# Patient Record
Sex: Female | Born: 1970 | Race: White | Hispanic: No | Marital: Married | Smoking: Never smoker
Health system: Southern US, Community
[De-identification: ages and names within clinical notes are randomized; demographics above are authoritative.]

## PROBLEM LIST (undated history)

## (undated) DIAGNOSIS — Z87442 Personal history of urinary calculi: Secondary | ICD-10-CM

## (undated) DIAGNOSIS — G90529 Complex regional pain syndrome I of unspecified lower limb: Secondary | ICD-10-CM

## (undated) DIAGNOSIS — R112 Nausea with vomiting, unspecified: Secondary | ICD-10-CM

## (undated) DIAGNOSIS — N736 Female pelvic peritoneal adhesions (postinfective): Secondary | ICD-10-CM

## (undated) DIAGNOSIS — R011 Cardiac murmur, unspecified: Secondary | ICD-10-CM

## (undated) DIAGNOSIS — F449 Dissociative and conversion disorder, unspecified: Secondary | ICD-10-CM

## (undated) DIAGNOSIS — N200 Calculus of kidney: Secondary | ICD-10-CM

## (undated) DIAGNOSIS — Z9889 Other specified postprocedural states: Secondary | ICD-10-CM

## (undated) DIAGNOSIS — T8859XA Other complications of anesthesia, initial encounter: Secondary | ICD-10-CM

## (undated) DIAGNOSIS — N879 Dysplasia of cervix uteri, unspecified: Secondary | ICD-10-CM

## (undated) DIAGNOSIS — M549 Dorsalgia, unspecified: Secondary | ICD-10-CM

## (undated) DIAGNOSIS — Z8489 Family history of other specified conditions: Secondary | ICD-10-CM

## (undated) DIAGNOSIS — G5600 Carpal tunnel syndrome, unspecified upper limb: Secondary | ICD-10-CM

## (undated) DIAGNOSIS — R200 Anesthesia of skin: Secondary | ICD-10-CM

## (undated) DIAGNOSIS — I209 Angina pectoris, unspecified: Secondary | ICD-10-CM

## (undated) DIAGNOSIS — M47816 Spondylosis without myelopathy or radiculopathy, lumbar region: Secondary | ICD-10-CM

## (undated) DIAGNOSIS — K589 Irritable bowel syndrome without diarrhea: Secondary | ICD-10-CM

## (undated) DIAGNOSIS — I951 Orthostatic hypotension: Secondary | ICD-10-CM

## (undated) DIAGNOSIS — T4145XA Adverse effect of unspecified anesthetic, initial encounter: Secondary | ICD-10-CM

## (undated) HISTORY — PX: CARPAL TUNNEL RELEASE: SHX101

## (undated) HISTORY — PX: APPENDECTOMY: SHX54

## (undated) HISTORY — PX: LAPAROSCOPIC CHOLECYSTECTOMY: SUR755

## (undated) HISTORY — DX: Dysplasia of cervix uteri, unspecified: N87.9

## (undated) HISTORY — PX: COLPOSCOPY: SHX161

## (undated) HISTORY — DX: Complex regional pain syndrome i of unspecified lower limb: G90.529

## (undated) HISTORY — PX: CYSTOSCOPY: SUR368

## (undated) HISTORY — DX: Spondylosis without myelopathy or radiculopathy, lumbar region: M47.816

## (undated) HISTORY — PX: ERCP: SHX60

## (undated) HISTORY — PX: OTHER SURGICAL HISTORY: SHX169

## (undated) HISTORY — DX: Female pelvic peritoneal adhesions (postinfective): N73.6

## (undated) HISTORY — PX: KNEE ARTHROSCOPY: SUR90

## (undated) HISTORY — PX: GYNECOLOGIC CRYOSURGERY: SHX857

## (undated) HISTORY — PX: EXPLORATORY LAPAROTOMY: SUR591

## (undated) HISTORY — DX: Calculus of kidney: N20.0

## (undated) HISTORY — DX: Dissociative and conversion disorder, unspecified: F44.9

## (undated) HISTORY — DX: Carpal tunnel syndrome, unspecified upper limb: G56.00

---

## 1999-11-30 ENCOUNTER — Other Ambulatory Visit: Admission: RE | Admit: 1999-11-30 | Discharge: 1999-11-30 | Payer: Self-pay | Admitting: Gynecology

## 2000-03-18 ENCOUNTER — Inpatient Hospital Stay (HOSPITAL_COMMUNITY): Admission: AD | Admit: 2000-03-18 | Discharge: 2000-03-18 | Payer: Self-pay | Admitting: Gynecology

## 2000-04-10 ENCOUNTER — Encounter: Admission: RE | Admit: 2000-04-10 | Discharge: 2000-07-09 | Payer: Self-pay | Admitting: Gynecology

## 2000-05-26 ENCOUNTER — Inpatient Hospital Stay (HOSPITAL_COMMUNITY): Admission: AD | Admit: 2000-05-26 | Discharge: 2000-05-26 | Payer: Self-pay | Admitting: Gynecology

## 2000-05-27 ENCOUNTER — Inpatient Hospital Stay (HOSPITAL_COMMUNITY): Admission: AD | Admit: 2000-05-27 | Discharge: 2000-05-27 | Payer: Self-pay | Admitting: Gynecology

## 2000-06-08 ENCOUNTER — Inpatient Hospital Stay (HOSPITAL_COMMUNITY): Admission: AD | Admit: 2000-06-08 | Discharge: 2000-06-08 | Payer: Self-pay | Admitting: *Deleted

## 2000-06-17 ENCOUNTER — Inpatient Hospital Stay (HOSPITAL_COMMUNITY): Admission: AD | Admit: 2000-06-17 | Discharge: 2000-06-20 | Payer: Self-pay | Admitting: Internal Medicine

## 2000-07-30 ENCOUNTER — Other Ambulatory Visit: Admission: RE | Admit: 2000-07-30 | Discharge: 2000-07-30 | Payer: Self-pay | Admitting: *Deleted

## 2001-06-05 ENCOUNTER — Encounter (INDEPENDENT_AMBULATORY_CARE_PROVIDER_SITE_OTHER): Payer: Self-pay | Admitting: Specialist

## 2001-06-05 ENCOUNTER — Encounter: Payer: Self-pay | Admitting: Surgery

## 2001-06-06 ENCOUNTER — Inpatient Hospital Stay (HOSPITAL_COMMUNITY): Admission: EM | Admit: 2001-06-06 | Discharge: 2001-06-08 | Payer: Self-pay | Admitting: Emergency Medicine

## 2001-06-07 ENCOUNTER — Encounter: Payer: Self-pay | Admitting: General Surgery

## 2001-06-12 ENCOUNTER — Encounter (INDEPENDENT_AMBULATORY_CARE_PROVIDER_SITE_OTHER): Payer: Self-pay | Admitting: Specialist

## 2001-06-12 ENCOUNTER — Ambulatory Visit (HOSPITAL_COMMUNITY): Admission: RE | Admit: 2001-06-12 | Discharge: 2001-06-12 | Payer: Self-pay | Admitting: Gastroenterology

## 2001-08-27 HISTORY — PX: PELVIC LAPAROSCOPY: SHX162

## 2001-10-03 ENCOUNTER — Other Ambulatory Visit: Admission: RE | Admit: 2001-10-03 | Discharge: 2001-10-03 | Payer: Self-pay | Admitting: Obstetrics and Gynecology

## 2002-03-30 ENCOUNTER — Ambulatory Visit (HOSPITAL_COMMUNITY): Admission: RE | Admit: 2002-03-30 | Discharge: 2002-03-30 | Payer: Self-pay | Admitting: Obstetrics and Gynecology

## 2002-03-30 ENCOUNTER — Encounter (INDEPENDENT_AMBULATORY_CARE_PROVIDER_SITE_OTHER): Payer: Self-pay | Admitting: Specialist

## 2002-10-05 ENCOUNTER — Other Ambulatory Visit: Admission: RE | Admit: 2002-10-05 | Discharge: 2002-10-05 | Payer: Self-pay | Admitting: Obstetrics and Gynecology

## 2003-10-07 ENCOUNTER — Other Ambulatory Visit: Admission: RE | Admit: 2003-10-07 | Discharge: 2003-10-07 | Payer: Self-pay | Admitting: Obstetrics and Gynecology

## 2004-10-09 ENCOUNTER — Other Ambulatory Visit: Admission: RE | Admit: 2004-10-09 | Discharge: 2004-10-09 | Payer: Self-pay | Admitting: Obstetrics and Gynecology

## 2005-10-15 ENCOUNTER — Other Ambulatory Visit: Admission: RE | Admit: 2005-10-15 | Discharge: 2005-10-15 | Payer: Self-pay | Admitting: Obstetrics and Gynecology

## 2006-10-17 ENCOUNTER — Other Ambulatory Visit: Admission: RE | Admit: 2006-10-17 | Discharge: 2006-10-17 | Payer: Self-pay | Admitting: Obstetrics and Gynecology

## 2007-11-18 ENCOUNTER — Other Ambulatory Visit: Admission: RE | Admit: 2007-11-18 | Discharge: 2007-11-18 | Payer: Self-pay | Admitting: Obstetrics and Gynecology

## 2008-08-27 HISTORY — PX: KNEE SURGERY: SHX244

## 2008-10-14 ENCOUNTER — Ambulatory Visit (HOSPITAL_BASED_OUTPATIENT_CLINIC_OR_DEPARTMENT_OTHER): Admission: RE | Admit: 2008-10-14 | Discharge: 2008-10-14 | Payer: Self-pay | Admitting: Orthopedic Surgery

## 2008-12-02 ENCOUNTER — Ambulatory Visit: Payer: Self-pay | Admitting: Obstetrics and Gynecology

## 2008-12-02 ENCOUNTER — Encounter: Payer: Self-pay | Admitting: Obstetrics and Gynecology

## 2008-12-02 ENCOUNTER — Other Ambulatory Visit: Admission: RE | Admit: 2008-12-02 | Discharge: 2008-12-02 | Payer: Self-pay | Admitting: Obstetrics and Gynecology

## 2009-12-14 ENCOUNTER — Ambulatory Visit: Payer: Self-pay | Admitting: Obstetrics and Gynecology

## 2009-12-14 ENCOUNTER — Other Ambulatory Visit: Admission: RE | Admit: 2009-12-14 | Discharge: 2009-12-14 | Payer: Self-pay | Admitting: Obstetrics and Gynecology

## 2010-12-12 LAB — POCT HEMOGLOBIN-HEMACUE: Hemoglobin: 13.2 g/dL (ref 12.0–15.0)

## 2010-12-20 ENCOUNTER — Encounter: Payer: Self-pay | Admitting: Obstetrics and Gynecology

## 2011-01-02 ENCOUNTER — Encounter: Payer: Self-pay | Admitting: Obstetrics and Gynecology

## 2011-01-04 ENCOUNTER — Encounter (INDEPENDENT_AMBULATORY_CARE_PROVIDER_SITE_OTHER): Payer: BC Managed Care – PPO | Admitting: Obstetrics and Gynecology

## 2011-01-04 ENCOUNTER — Other Ambulatory Visit: Payer: Self-pay | Admitting: Obstetrics and Gynecology

## 2011-01-04 ENCOUNTER — Other Ambulatory Visit (HOSPITAL_COMMUNITY)
Admission: RE | Admit: 2011-01-04 | Discharge: 2011-01-04 | Disposition: A | Discharge status: Home / Self Care | Payer: BC Managed Care – PPO | Source: Ambulatory Visit | Attending: Obstetrics and Gynecology | Admitting: Obstetrics and Gynecology

## 2011-01-04 DIAGNOSIS — Z124 Encounter for screening for malignant neoplasm of cervix: Secondary | ICD-10-CM | POA: Insufficient documentation

## 2011-01-04 DIAGNOSIS — R823 Hemoglobinuria: Secondary | ICD-10-CM

## 2011-01-04 DIAGNOSIS — Z01419 Encounter for gynecological examination (general) (routine) without abnormal findings: Secondary | ICD-10-CM

## 2011-01-08 ENCOUNTER — Other Ambulatory Visit: Payer: BC Managed Care – PPO

## 2011-01-09 NOTE — Op Note (Signed)
NAME:  Short, Sydney                  ACCOUNT NO.:  [id number]   MEDICAL RECORD NO.:  [id number]          PATIENT TYPE:  AMB   LOCATION:  DSC                          FACILITY:  [facility]   PHYSICIAN:  Rodney A. Mortenson, M.D.DATE OF BIRTH:  06-Apr-1971   DATE OF PROCEDURE:  10/14/2008  DATE OF DISCHARGE:                               OPERATIVE REPORT   JUSTIFICATION:  A 40 year old female with long history of left knee  pain.  Because of the persistent pain and discomfort, an MRI was  eventually done.  The MRI was absolutely normal.  However, she has  similar symptoms on the other legs several years ago with negative MRI  and did have a lesion.  The patient now wishes to have exploratory of  the knee understanding that no guarantee could be given as to the side  of pathology or if there is pathology.  Nevertheless, she wished to  proceed.  Questions are answered and encouraged.  Complications are  discussed.   JUSTIFICATION FOR OUTPATIENT SURGERY:  Minimal morbidity.   PREOPERATIVE DIAGNOSIS:  Painful left knee, etiology unknown.   POSTOPERATIVE DIAGNOSIS:  Large medial shelf plica, left knee.   OPERATION:  Excise plica and superior shelf.   SURGEON:  Lenard Galloway. Chaney Malling, MD   ANESTHESIA:  General.   PATHOLOGY:  We arthroscoped the knee and a very careful examination was  undertaken.  The patellofemoral joint was absolutely normal.  There was  normal articular cartilage of the medial and lateral femoral condyles,  medial and lateral tibial plateaus.  Both menisci were examined very  carefully, no tears were seen.  The anterior cruciate ligament was  normal.  There was a large medial plica and there was a transverse  superior shelf.   PROCEDURE:  The patient was placed on the operating table in the supine  position with a pneumatic tourniquet about the left upper thigh.  The  entire left lower extremity was prepped with DuraPrep and draped out in  the usual manner.  An infusion  cannula was placed in the superior medial  shelf and the knee was distended with saline.  Anteromedial and  anterolateral portals were made and the arthroscope was introduced.  The  findings as described above.  Attention was first turned to the  transverse large superior shelf and this was debrided with the  intraarticular shaver.  Complete decompression of the shelf was  achieved.  Next, attention was turned to the large medial plica and this  was debrided with intraarticular shaver.  Complete decompression of the  plica was done.  There was a fair amount of bleeding and ArthroCare wand  was inserted and bleeding was controlled very nicely.  Knee was then  filled with Marcaine.  A large bulky pressure dressing was applied and  the patient returned to the recovery room in excellent condition.  Technically, this procedure went extremely well.   DISPOSITION:  1. Usual postoperative instructions.  2. Percocet for pain.  3. To my office next week on Wednesday.      Rodney A. Chaney Malling, M.D.  Electronically  Signed     RAM/MEDQ  D:  10/14/2008  T:  10/14/2008  Job:  [id number]

## 2011-01-12 NOTE — Consult Note (Signed)
[facility]  Patient:    Sydney Short, Sydney Short Visit Number: [id number] MRN: [id number]          Service Type: OBV Location: 1E 0101 01 Attending Physician:  Charlton Haws Dictated by:   Llana Aliment. Randa Evens, M.D. Proc. Date: 06/06/01 Admit Date:  06/05/2001   CC:         Currie Paris, M.D.  Kizzie Furnish, M.D.  Veverly Fells. Vernie Ammons, M.D.  Daniel L. Eda Paschal, M.D.   Consultation Report  REASON FOR CONSULTATION:  Abdominal pain.  HISTORY:  A delightful 40 year old white female who has had six days of abdominal pain.  Prior to this she had one soft bowel movement a day with no problems.  She had gradual onset of left-sided abdominal pain approximately six days ago.  It has gotten progressively worse.  She has had occasional fever and chills with this but has not taken her temperature.  She has been nauseated and has gotten progressively more nauseated and vomiting now on a daily basis.  Along with these symptoms she has had diarrhea that has gotten progressively worse over the past three days.  The first three days she had pain, but no diarrhea.  She is now having 8-10 loose, watery bowel movements per day that are nonbloody, despite the use of Vicodin for pain.  She has seen Kizzie Furnish, M.D. in the office.  Has gone to the emergency room in [place] where she apparently had a CT scan or some test done to rule out kidney stones which was reported to be negative and had negative laboratories. She has also been examined at [facility] L. Gott[facility]gen, M.D. office, had a pelvic examination and ultrasound of the pelvis that were normal.  She started her period during all this and has come into the emergency room.  Had a CT of the abdomen and pelvis which showed only minimal amount of free fluid in the pelvis.  Otherwise, examination is completely normal.  White count, CMET, liver function tests were normal.  Urinalysis revealed large amount of  blood with a few leukocytes, 3-6 red blood cells per high powered field.  It is notable that the patient is having her period.  We were asked to see her for further evaluation of her pain and due to the continued diarrhea that she has been having.  MEDICATIONS:  Vicodin.  ALLERGIES:  None.  PAST MEDICAL HISTORY: 1. No chronic medical problems. 2. Status post cholecystectomy, in 1994 had a postoperative ERCP due to    continuing postoperative symptoms that reportedly was negative. 3. Status post appendectomy approximately 1996 or 1997 by Dr. Daphine Deutscher with    preoperative colonoscopy by Everardo All. Madilyn Fireman, M.D. at that time that was    reportedly normal other than appendicitis. 4. History of renal stones.  Has had five procedure to remove renal stones    with the basket.  She has had also a laparoscopy done for GYN pain in the    past with the finding of ovarian cyst.  She does not know if there were    adhesions present or not.  SOCIAL HISTORY:  The patient is married.  Does not smoke or drink.  FAMILY HISTORY:  Skin cancer in the family.  Both parents are healthy.  REVIEW OF SYSTEMS:  Remarkable for the fact that she is having her menses right now which has not really made her pain better nor worse and the fact that she has one normal bowel  movement a day that diarrhea nor constipation are a problem for her.  PHYSICAL EXAMINATION  VITAL SIGNS:  Temperature 97.4, respirations 22, pulse 93, blood pressure 129/74.  GENERAL:  Somewhat obese white female in no acute distress.  HEENT:  Eyes:  Clear, non-icteric.  Extraocular movements are intact.  NECK:  Supple.  No lymphadenopathy.  LUNGS:  Clear.  HEART:  Regular rate and rhythm without murmurs or gallops.  ABDOMEN:  Soft, nondistended with positive bowel sounds.  Marked localized tenderness in the left lower quadrant.  ASSESSMENT:  Severe left lower quadrant pain/hematuria/diarrhea.  ______ is certainly in the differential.   She does have some hematuria, but is having her menses and this may not be significant but we must consider renal stones as well given her history of renal stones.  PLAN: 1. Will continue pain control, aggressive IV hydration in case she is passing    a kidney stone. 2. Will go ahead with an unprepped sigmoid today to rule out colitis.  I have    discussed this with the patient.  She is agreeable.  If this is negative    she may need an IVP, laparoscopy, etcetera. 3. Culture stools.  Hold antibiotics until after the sigmoidoscopy. Dictated by:   Llana Aliment. Randa Evens, M.D. Attending Physician:  Charlton Haws DD:  06/06/01 TD:  06/06/01 Job: [id number]

## 2011-01-12 NOTE — Discharge Summary (Signed)
[facility]  Patient:    Sydney Short, Sydney Short Visit Number: [id number] MRN: [id number]          Service Type: END Location: ENDO Attending Physician:  Louie Bun Dictated by:   Currie Paris, M.D. Admit Date:  06/12/2001 Discharge Date: 06/12/2001   CC:         John C. Madilyn Fireman, M.D.  Dr. Leatrice Jewels, [place]   Discharge Summary  DISCHARGE DIAGNOSES: 1. Left lower quadrant pain of uncertain etiology. 2. Diarrhea.  HISTORY OF PRESENT ILLNESS:  Sydney Short is a 40 year old who presented to Dr. Fayrene Fearing office and then to our emergency room on October 10, with a complaint of abdominal pain.  It had been about five days in duration and had been quite severe at times.  She had had a pelvic ultrasound which had been reported negatively.  She had been seen by Dr. Eda Paschal, her gynecologist, and thought not to have a pelvic problem.  Because of her severe pain, she was sent to the emergency room.  PHYSICAL EXAMINATION:  GENERAL:  She appeared to be uncomfortable, but not in acute pain at the time I saw her.  ABDOMEN:  Soft, but some left lower quadrant tenderness.  LABORATORY DATA AND X-RAY FINDINGS:  In the emergency room, labs were reasonably unremarkable.  Her hemoglobin was 13.8, white count 7300.  CMET was normal.  Urinalysis did show some blood, but she was having her menstrual period and a prior urinalysis had been reported to [place] as negative.  CT scan was obtained because of her pain and was basically negative with no evidence of other acute processes.  Of significance, is the fact that the patient has had both appendectomy, which was done for chronic abdominal pain, and cholecystectomy.  The patient also of note, had had watery bowel movements with 8-10 per day at the time of admission.  HOSPITAL COURSE:  The patient was admitted initially as an observation patient, but then converted to a full admission when she was unable to go  home within the 24 hours.  She was admitted on the evening of October 10, and seen the next morning by Dr. Vilinda Boehringer for GI.  He recommended that she undergo flexible sigmoidoscopy and that was accomplished.  There was some edema seen in the sigmoid colon area, but otherwise negative.  The next morning on October 11, her pain was about the same and her white count on repeat was normal.  Repeat GYN exam was obtained and this was likewise without help in terms of identifying a source.  On October 12, she was about the same with continued discomfort and some diarrhea.  She apparently had had some blood in her diarrhea stools.  She had had a history of stones, but no evidence of stone was found and repeat urinalysis was clear.  By October 13, she was feeling better and felt able to be discharged to be followed up by Dr. Madilyn Fireman. At the time of discharge, stool cultures and biopsies were pending and would be followed up by his office.  On the chart, we have the final pathology on sigmoid biopsies which showed unremarkable colonic mucosa with no active inflammation.  DISCHARGE MEDICATIONS: 1. Resume home medications. 2. Phenergan for nausea. 3. Take no aspirin products, no Imodium or Lomotil.  DIET:  Very slowly advance to soft diet.  SPECIAL INSTRUCTIONS:  Call Dr. Madilyn Fireman should any further pain develop. Dictated by:   Currie Paris, M.D. Attending Physician:  Louie Bun DD:  06/16/01 TD:  06/17/01 Job: [id number]

## 2011-01-12 NOTE — Discharge Summary (Signed)
[facility]  Patient:    Sydney Short, Sydney Short                         MRN: [id number] Adm. Date:  [id number] Disc. Date: [id number] Attending:  Tonye Royalty Dictator:   Antony Contras, [facility]                           Discharge Summary  DISCHARGE DIAGNOSES:          1. Intrauterine pregnancy at 38-1/2 weeks.                               2. History of kidney stones with current                                  pregnancy.                               3. Arrest of cervical dilatation and                                  malpresentation.  PROCEDURE:                    Low transverse cesarean section with delivery of                               viable infant.  HISTORY OF PRESENT ILLNESS:   The patient is a 40 year old, gravida 3, para 0-1-1-1, with an EDC of June 29, 2000.  Prenatal risk factors include a history of kidney stones with this current pregnancy.  The patient also had a history of in the past of cryosurgery and LEEP and also premature rupture of membranes with a previous pregnancy of 35 weeks.  PRENATAL LABORATORY DATA:     Blood type O positive, antibody screen negative. RPR, HBsAg, HIV nonreactive.  Rubella immune.  MS-AFP is normal.  GBS status not in chart.  HOSPITAL COURSE:              The patient was admitted on June 16, 2000, with a history of increasing regularity of contractions.  The patient does live a significant distance from the hospital approximately 57 miles.  At that point she was 2-3 cm dilated, 70% effaced, and -2 to -3 station.  The patient was admitted for observation and planned augmentation.  GBS culture was negative.  The patient did progress to 9 cm and remained in OP position x 2 hours with adequate contractions, and it was decided to proceed with cesarean section secondary to arrest in dilatation and malpresentation.  This was performed by Dr. Penni Homans assisted by Dr. Kathryne Sharper.  The patient  was delivered of an Apgar 9/9, 7-pound 6 ounce female infant.  Normal placenta, ovaries, tubes, and ovaries.  Postpartum course:  The patient remained afebrile, had no no difficulty voiding, and was able to be discharged on her third postoperative day.  LABORATORY DATA:              CBC:  Hematocrit 37.2, hemoglobin 9.6, WBC 14, platelets 296.  CONDITION ON DISCHARGE:  She was discharged in satisfactory condition.  FOLLOWUP:                     Follow up in six weeks.  DISCHARGE MEDICATIONS:        Continue prenatal vitamins and iron.  Motrin and Tylox for pain. DD:  07/15/00 TD:  07/15/00 Job: [id number]

## 2011-01-12 NOTE — H&P (Signed)
[facility]  Patient:    Short, [name]                         MRN: [id number] Adm. Date:  [id number] Disc. Date: [id number] Attending:  Wetzel Bjornstad                         History and Physical  CHIEF COMPLAINT:              Contractions.  HISTORY OF PRESENT ILLNESS:   The patient is a 40 year old gravida 3, para 1, AB 1, at 38-1/2 weeks estimated gestational age.  She began complaining of contractions at noon this afternoon.  Contractions started increasing in intensity and frequency to every 2-3 minutes apart.  She lives a significant distance from the hospital, approximately 57 miles near [place], [place].  When she presented to [facility] she was found to be contracting every 2-4 minutes apart, with a reassuring fetal heart rate tracing and her cervix was reported to be 3 cm, 70% effaced, vertex, minus 2 station.  Vital signs were stable.  She was admitted.  She has stayed in the office.  Her cervix had been reported to be 1 cm dilated, so she had made significant change since her last office visit on this past Thursday.  During this pregnancy prenatally, she has suffered from recurring urinary tract infections as a result of renal stones and had been followed by Dr. Vernie Ammons, a urologist here in our community.  She also had _________ one hour blood sugar screening, but had a normal three hour glucose tolerance test.  PAST MEDICAL HISTORY:         She has had cryotherapy as well as cervical leaf conization for dysplasia.  Of note also during this pregnancy she had preterm labor and had been on terbutaline p.o. 2.5 mg q.4h.  Patient also had spontaneous AB in the past and in 1998 she had a premature delivery at [redacted] weeks gestation due to premature rupture of membranes.  She has also had laparoscopy in 1995, had a biopsy with kidney stones in 1993 and 1996.  She has had cholelithiasis in 1994 and 1996, appendectomy in 1996, as well  as knee surgery in 1996.  ALLERGIES:                    She is not allergic to any medication.  REVIEW OF SYSTEMS:            See [place] form.  PHYSICAL EXAMINATION:  VITAL SIGNS:                  On admission her temperature was 98, pulse 88, respirations 20, blood pressure 145/83.  Fetal heart rate 140-150 beats per minute with reassuring fetal heart rate tracing.  HEENT:                        Unremarkable.  NECK:                         Supple, trachea midline.  No carotid bruits or thyromegaly.  LUNGS:                        Clear to auscultation without rhonchi or wheezes.  HEART:  Heart regular rate and rhythm, no murmurs or gallops.  BREASTS:                      Breast examination done during the first trimester reported to be normal.  ABDOMEN:                      Gravid uterus, vertex presentation by Thayer Ohm maneuver.  Fetal heart tones are described above.  PELVIC:                       She is 2-3 cm dilated, 70% effaced, minus 2 to minus 3 station.  EXTREMITIES:                  DTRs 1+, negative clonus.  PRENATAL LABORATORY:          O positive blood type, negative antibody screen. VDRL was nonreactive, Rubella immune, Hepatitis B surface antigens were negative.  _________ was normal.  Alpha theta protein was normal.  Diabetes screen is described above.  Pap smear was normal and GBS culture was negative.  ASSESSMENT:                   A 40 year old gravida 3, para 1, AB 1, at 38-1/2 weeks estimated gestational age, who presented to [facility] in labor and with advanced cervical dilatation, cervix 2-3 cm dilated, 80% effaced, minus 2 to minus 3 station, with a reassuring fetal heart rate tracing.  Due to patients geographical distance from the hospital and living in [place], it was decided to admit her tonight and ambulate on the ward and subsequently augment with Pitocin for planned controlled delivery.   Her GBS culture was negative.  This was discussed with the husband and patient who were grateful to come in at this time due to their concerns and anxiety and living so far from the hospital.  PLAN:                         1. As per assessment above.                               2. Anticipate vaginal delivery. DD:  06/16/00 TD:  06/16/00 Job: [id number]

## 2011-01-12 NOTE — Op Note (Signed)
NAME:  Sydney Short, Sydney Short                            ACCOUNT NO.:  [id number]   MEDICAL RECORD NO.:  [id number]                   PATIENT TYPE:  AMB   LOCATION:  SDC                                  FACILITY:  [facility]   PHYSICIAN:  Daniel L. Eda Paschal, M.D.           DATE OF BIRTH:  12-Jul-1971   DATE OF PROCEDURE:  03/30/2002  DATE OF DISCHARGE:  03/30/2002                                 OPERATIVE REPORT   PREOPERATIVE DIAGNOSES:  Pelvic pain.   POSTOPERATIVE DIAGNOSES:  Pelvic pain with pelvic adhesion.   NAME OF OPERATION:  Laparoscopy with lysis of pelvic adhesions.   SURGEON:  Daniel L. Eda Paschal, M.D.   ANESTHESIA:  General endotracheal.   INDICATIONS:  The patient is a 39 year old female who entered the hospital  for diagnostic laparoscopy because of chronic pelvic pain where there has  been no etiology discovered so far.  She has had a complete GI evaluation  including upper and lower GI tract based on endoscopy examinations.  She has  been treated for irritable bowel syndrome and yet her pain has persisted.  Ultrasound was normal.  She enters the hospital now for laparoscopy and any  appropriate surgery.   FINDINGS:  At the time of laparoscopy patient had a mobile normal uterus  without any evidence of endometriosis.  Both ovaries were normal.  Both  fallopian tubes were normal with luxuriant fimbria.  There were no  peritubular or periovarian adhesions.  There was no cul-de-sac disease  whatsoever.  The ileocecal junction was identified and there were staples  where the patient had a previous appendectomy.  The right upper quadrant was  visualized and was normal without any evidence of adhesive disease or  endometriosis.  The only abnormal findings were that there was an adhesion  between the sigmoid colon and the left broad ligament wall laterally.   PROCEDURE:  After adequate general endotracheal anesthesia the patient was  placed in the dorsal lithotomy position and  prepped and draped in the usual  sterile manner.  The patient'Short bladder was entered with the [facility]  catheter.  A Hulka catheter was inserted into the uterus.  Subumbilical  incision was made and pneumoperitoneum was created with a Verhey needle.  Then a 10 mm trocar was placed subumbilically and through that the operating  laparoscopic was placed.  A 5 mm port was placed suprapubically in the  midline.  The pelvis was visualized and was as noted above.  The uterus  could also be identified which was not described above and they peristalsed  and moved normally without any limitation of their mobility.  Following all  this the adhesion was lysed.  Both ends of the adhesions were bipolared and  then it was cut and the adhesion was removed and sent to pathology to be  sure there was not endometriosis in the adhesion.  At the termination of the  procedure  there was no bleeding noted.  The trocars were removed.  The  pneumoperitoneum was evacuated and the subumbilical fascial incision was  closed with 0 Vicryl.  The skin incisions were closed with Steri-Strips.  The patient tolerated procedure well and left the operating room in  satisfactory condition.                                               Daniel L. Eda Paschal, M.D.   Sydney Short  D:  03/30/2002  T:  04/03/2002  Job:  [id number]

## 2011-01-12 NOTE — H&P (Signed)
[facility]  Patient:    Sydney Short, Sydney Short Visit Number: [id number] MRN: [id number]          Service Type: OBV Location: 1E 0101 01 Attending Physician:  Benny Lennert Dictated by:   Currie Paris, M.D. Admit Date:  06/05/2001   CC:         Dr. Leatrice Jewels, [place]   History and Physical  ACCOUNT NUMBER:  [id number]  CHIEF COMPLAINT:  Abdominal pain.  CLINICAL HISTORY:  Ms. Sydney Short is a 40 year old woman who has had about five days worth of abdominal pain which has primarily been left lower quadrant and associated with some mild nausea and vomiting.  It was fairly severe today and when she was in Dr. Fayrene Fearing, she was apparently "writhing" in pain.  She has apparently had a pelvic ultrasound, which has been negative, and has been seen by her gynecologist and not thought to have a pelvic problem.  When she initially had the pain, she was also seen in the emergency room down in [place], [place], and reportedly had a normal white count at that time.  She was sent to the [facility] ER for [place] to evaluate because of ongoing pain and the possibility that this represented a surgical abdomen. The patient is otherwise in generally good health.  She has had a laparoscopic appendectomy and a laparoscopic cholecystectomy, both by [place]. Daphine Deutscher, M.D., both many years ago.  She has not otherwise recently had any other GI complaints.  John C. Madilyn Fireman, M.D., has apparently done a colonoscopy in the past on her.  The patients past history is basically otherwise unremarkable. She does note that she had several loose stools today, but has not noticed any blood in them.  She just started her menstrual period today.  ALLERGIES:  She has no known allergies.  PHYSICAL EXAMINATION:  The patient is alert and oriented.  She appears to be uncomfortable, but at this point not in acute pain.  HEENT:  Head:  Normocephalic.  Eyes:  Nonicteric.  Pupils round  and regular.  NECK:  Supple.  No masses or thyromegaly.  LUNGS:  Clear to auscultation.  HEART:  Regular with no murmurs, rubs, or gallops.  ABDOMEN:  Soft, but she has distinct and fairly marked left lower quadrant tenderness with some involuntary guarding there.  The abdomen is not particularly distended.  Bowel sounds are present.  RECTAL:  Was not done.  EXTREMITIES:  No cyanosis or edema.  LABORATORY STUDIES:  Basically show a normal CMET, a normal urinalysis, a normal amylase, and a normal CBC with a normal white count and normal differential.  A CT scan of the abdomen is likewise normal.  That is reviewed with Elsie Stain, M.D.  There is no evidence of inflammatory processes, bowel obstructions, etc.  IMPRESSION:  Persistent left lower quadrant abdominal pain with nausea and now diarrhea.  PLAN:  I will admit to be sure we have her well hydrated.  Will repeat her labs in the morning.  Will obtain stools for guaiac, O&P, and CNS.  Will obtain a GI evaluation in the morning with possible GYN re-evaluation.  Maybe this is some self-limiting gastroenteritis and the patient will be able to be discharged after further evaluation, but I feel this evening it will be best to have her in, be sure that she is adequately hydrated, obtain these other test, and also for pain control. Dictated by:   Currie Paris, M.D. Attending Physician:  Benny Lennert DD:  06/05/01 TD:  06/06/01 Job: [id number]

## 2011-01-12 NOTE — Procedures (Signed)
[facility]  Patient:    Sydney Short, Sydney Short Visit Number: [id number] MRN: [id number]          Service Type: OBV Location: 3W 6962 01 Attending Physician:  Charlton Haws Proc. Date: 06/06/01 Admit Date:  06/05/2001   CC:         Currie Paris, M.D.                           Procedure Report  PROCEDURE:  Flexible sigmoidoscopy.  INDICATION FOR PROCEDURE:  Left lower quadrant abdominal pain becoming increasing severe with work-up unrevealing to date with associated reported watery diarrhea.  DESCRIPTION OF PROCEDURE:  The patient was placed in the left lateral decubitus position and placed on the pulse monitor with continuous low-flow oxygen delivered by nasal cannula.  She was sedated with 50 mg IV Demerol and 5 mg IV Versed.  The Olympus video colonoscope was inserted into the rectum and advanced to about the mid transverse colon.  This was an unprepped procedure, and there was a lot of adherent stool throughout, making appreciation of mucosal detail difficult.  There was some vague suggestion of some edema and possibly increased granularity in the mid sigmoid colon, although the interpretation was obscured by the poor prep, and I am not sure that this represented any real pathology.  I did take biopsies of this area. Above 35 cm, the mucosa, as best could be seen, looked completely normal as did the rectal mucosa.  No ulcers, diverticula, strictures, masses, or other abnormalities were noted.  The scope was then withdrawn, and the patient returned to the recovery room in stable condition.  She tolerated the procedure well, and there were no immediate complications.  IMPRESSION:  Basically normal study with possible suggestion of slight edema of the sigmoid colon, significance unclear.  PLAN:  Await sigmoid biopsies.  Further work-up as indicated. Attending Physician:  Charlton Haws DD:  06/06/01 TD:  06/07/01 Job:  [id number]

## 2011-01-12 NOTE — Procedures (Signed)
[facility]  Patient:    Sydney Short, Sydney Short Visit Number: [id number] MRN: [id number]          Service Type: END Location: ENDO Attending Physician:  Louie Bun Dictated by:   Everardo All Madilyn Fireman, M.D. Proc. Date: 06/12/01 Admit Date:  06/12/2001                             Procedure Report  PROCEDURE:  Colonoscopy.  ENDOSCOPIST:  Everardo All. Madilyn Fireman, M.D.  INDICATIONS:  Abdominal pain and chronic diarrhea with negative work-up to date.  DESCRIPTION OF PROCEDURE:  The patient was placed in the left lateral decubitus position and placed on the pulse monitor with continuous low flow oxygen delivered by nasal cannula.  She was sedated with 70 mg of IV Demerol and 8 mg of IV Versed.  The Olympus video colonoscope was inserted into the rectum and advanced to the cecum, confirmed by transillumination of McBurneys point and visualization of the ileocecal valve and appendiceal orifice.  Prep was good.  The terminal ileum was intubated and explored for several centimeters and appeared to be within normal limits.  The scope was withdrawn back out into the cecum.  The cecum, ascending, transverse, descending and sigmoid colon all appeared normal with no masses, polyps, diverticula or other mucosal abnormalities.  Random biopsies were taken of the ascending and transverse colon since on previous sigmoidoscopy biopsies had been taken from the sigmoid and it had been unrevealing.  The colonoscope was then withdrawn, and the patient returned to the recovery room in stable condition.  She tolerated the procedure well, and there were no immediate complications.  IMPRESSION:  Essentially normal colonoscopy including terminal ileum.  PLAN:  Expectant management for now since she has admitted to some mild improvement in the last few days. Dictated by:   Everardo All Madilyn Fireman, M.D. Attending Physician:  Louie Bun DD:  06/12/01 TD:  06/12/01 Job: [id number]

## 2011-01-12 NOTE — Op Note (Signed)
[facility]  Patient:    Sydney Short, Sydney Short                         MRN: [id number] Proc. Date: 06/17/00 Adm. Date:  [id number] Attending:  Tonye Royalty                           Operative Report  PREOPERATIVE DIAGNOSES:       1. A 38-week intrauterine pregnancy.                               2. Arrest with dilatation.                               3. Malpresentation.  POSTOPERATIVE DIAGNOSES:      1. A 38-week intrauterine pregnancy.                               2. Arrest with dilatation.                               3. Malpresentation.  OPERATION:                    Primary low transverse cesarean section.  SURGEON:                      Katy Fitch, M.D.  ASSISTANT:                    Cecilio Asper, M.D.  ANESTHESIA:                   Epidural.  ESTIMATED BLOOD LOSS:         750 cc.  URINE OUTPUT:                 100 and clear.  FLUIDS:                       1600 of crystalloid.  COMPLICATIONS:                None.  SPECIMENS:                    Cord blood.  DRAINS:                       Foley.  FINDINGS:                     A living 6 pound and 7 ounce female with Apgars at 9 and 9 with three vessel cord, and normal-appearing placenta, uterus, tubes, and ovaries.  Fetus noted to be in the OP position with marked caput.  DESCRIPTION OF PROCEDURE:     After having the patient in adequate anesthesia and uterine contractions for two hours, the patient had an arrestive dilatation at 9 cm, and noted to be in OP position.  At that point, the patient was advised for a cesarean section secondary to malpresentation and arrestive dilatation.  The patient was explained the risks and benefits, the patient opted for cesarean section.  The patient was taken to the operating room where epidural anesthesia was bolused, and then prepped and draped in a normal sterile fashion.  A Pfannenstiel skin  incision was made and carried down to the underlying fascia which was excised and extended transversely using curved Mayo scissors.  Kocher clamps were placed on the superior fascial borders and the rectus muscles were dissected off both bluntly and sharply with the curved Mayo scissors.  In a similar fashion, the inferior fascial border was grasped with Kocher clamps and the rectus muscles were dissected out both bluntly and sharply with the curved Mayo scissors.  The rectus muscles were separated in the midline manually.  The underlying peritoneum identified and grasped with hemostats, and opened with Metzenbaum scissors.  The peritoneum was extended both superiorly and inferiorly with good visualization of the bladder.  A bladder blade was then placed into the pelvis and the vesicouterine peritoneum identified and incised with the Metzenbaum scissors.  The incision was extended transversely and a bladder flap was created digitally.  The bladder blade was then reinserted in the newly created bladder flap and a low transverse uterine incision was made with the scalpel and extended laterally with bandaged scissors.  The infants head was then delivered in the OP presentation and the rest of the body delivered atraumatically.  The mouth and nose were suctioned.  The cord clamped and cut and handed off to the ______ in attendance.  The cord blood was then obtained and the placenta was removed from the uterus by massage therapy.  The uterus was then exteriorized and wrapped with a moist tape, and cleared of all clots and debris.  A running 1-0 chromic interlocking suture was then used to close the first layer with some minimal bleeding.  A second layer was used to close the lower uterine segment with hemostasis noted.  The posterior cul-de-sac was then cleared of all clots and debris, and the uterus was returned to the abdominal cavity.                                The bladder blade was then  reinserted and irrigation was then performed, and there are several areas of bleeding inferior to the uterine incision which were Bovie cauterized.  Irrigation was once again performed and hemostasis was noted.  The rectus muscles were then reapproximated using 2-0 chromic interrupted sutures.  The fascia was then reapproximated using 0 Vicryl in a running continuous stitch, starting at the corners and meeting in the midline.  The subcutaneous tissue was then cauterized with the Bovie with minimal bleeders, and the skin was closed using staples.                                The patient was taken to the recovery room in stable condition. DD:  06/17/00 TD:  06/18/00 Job: [id number]

## 2011-12-26 ENCOUNTER — Encounter: Payer: Self-pay | Admitting: Obstetrics and Gynecology

## 2011-12-26 DIAGNOSIS — N200 Calculus of kidney: Secondary | ICD-10-CM | POA: Insufficient documentation

## 2011-12-26 DIAGNOSIS — N736 Female pelvic peritoneal adhesions (postinfective): Secondary | ICD-10-CM | POA: Insufficient documentation

## 2011-12-26 DIAGNOSIS — F449 Dissociative and conversion disorder, unspecified: Secondary | ICD-10-CM | POA: Insufficient documentation

## 2011-12-26 DIAGNOSIS — N809 Endometriosis, unspecified: Secondary | ICD-10-CM | POA: Insufficient documentation

## 2012-01-07 ENCOUNTER — Ambulatory Visit (INDEPENDENT_AMBULATORY_CARE_PROVIDER_SITE_OTHER): Payer: BC Managed Care – PPO | Admitting: Obstetrics and Gynecology

## 2012-01-07 ENCOUNTER — Encounter: Payer: Self-pay | Admitting: Obstetrics and Gynecology

## 2012-01-07 VITALS — BP 136/82 | Ht 64.0 in | Wt 181.0 lb

## 2012-01-07 DIAGNOSIS — N879 Dysplasia of cervix uteri, unspecified: Secondary | ICD-10-CM | POA: Insufficient documentation

## 2012-01-07 DIAGNOSIS — Z01419 Encounter for gynecological examination (general) (routine) without abnormal findings: Secondary | ICD-10-CM

## 2012-01-07 MED ORDER — NORELGESTROMIN-ETH ESTRADIOL 150-35 MCG/24HR TD PTWK: MEDICATED_PATCH | TRANSDERMAL | Status: DC

## 2012-01-07 NOTE — Progress Notes (Signed)
Patient came to see me today for her annual GYN exam. She remains on Ortho Evra patch for relief of transitional symptoms with a borderline FSH of 26. Her symptoms are more severe the week she is off the patch. She is due for a mammogram at 41. She is having no abnormal bleeding. She is having no pelvic pain. She has had elevated triglyceride levels which when they were last checked by her PCP are much better. She did not fast  Today. She is having some fatigue. She was treated for CIN with cryosurgery at 19 at a gynecologist office in [place]. We have had some suspicion that she did not have CIN since she had never been sexually active at that point.  Physical examination: Kennon Portela present. HEENT within normal limits. Neck: Thyroid not large. No masses. Supraclavicular nodes: not enlarged. Breasts: Examined in both sitting and lying  position. No skin changes and no masses. Abdomen: Soft no guarding rebound or masses or hernia. Pelvic: External: Within normal limits. BUS: Within normal limits. Vaginal:within normal limits. Good estrogen effect. No evidence of cystocele rectocele or enterocele. Cervix: clean. Uterus: Normal size and shape. Adnexa: No masses. Rectovaginal exam: Confirmatory and negative. Extremities: Within normal limits.  Assessment: #1. Transitional symptoms #2. Fatigue  Plan: We repeated our previous conversation about the higher levels of estrogen with a patch then the pill. She does not want to switch to a birth control pill. We will have her do her Ortho Evra patch every week to reduce transitional symptoms. She will stop it if she has breakthrough bleeding. She will get a mammogram at 41. At her PCP she will get a CBC, TSH, and lipid profile.

## 2012-01-09 LAB — URINALYSIS W MICROSCOPIC + REFLEX CULTURE
Casts: NONE SEEN
Glucose, UA: NEGATIVE mg/dL
Leukocytes, UA: NEGATIVE
Nitrite: NEGATIVE
Specific Gravity, Urine: 1.026 (ref 1.005–1.030)
Squamous Epithelial / LPF: NONE SEEN
pH: 6 (ref 5.0–8.0)

## 2012-01-10 ENCOUNTER — Other Ambulatory Visit: Payer: Self-pay | Admitting: Obstetrics and Gynecology

## 2012-01-10 MED ORDER — NITROFURANTOIN MONOHYD MACRO 100 MG PO CAPS: 100.0000 mg | ORAL_CAPSULE | Freq: Two times a day (BID) | ORAL | Status: AC

## 2012-06-23 ENCOUNTER — Other Ambulatory Visit: Payer: Self-pay | Admitting: Obstetrics and Gynecology

## 2012-08-04 ENCOUNTER — Telehealth: Payer: Self-pay | Admitting: *Deleted

## 2012-08-04 NOTE — Telephone Encounter (Signed)
Pt takes ortho eva patch she takes every week with no break. Pt is c/o bleeding for 6 weeks now, period flow not very heavy bloating and cramping with lower back pain. This bleeding episode would be the first time this has happened while on the patch. She is going to make appointment, I told her I let you know this information as well. Please advise

## 2012-08-04 NOTE — Telephone Encounter (Signed)
Pt will make OV. Asked to kim to call pt and schedule.

## 2012-08-04 NOTE — Telephone Encounter (Signed)
Needs appointment

## 2012-08-18 ENCOUNTER — Ambulatory Visit (INDEPENDENT_AMBULATORY_CARE_PROVIDER_SITE_OTHER): Payer: BC Managed Care – PPO | Admitting: Obstetrics and Gynecology

## 2012-08-18 DIAGNOSIS — N938 Other specified abnormal uterine and vaginal bleeding: Secondary | ICD-10-CM

## 2012-08-18 DIAGNOSIS — N949 Unspecified condition associated with female genital organs and menstrual cycle: Secondary | ICD-10-CM

## 2012-08-18 NOTE — Patient Instructions (Signed)
Stop patch for  one week. Then do  Patch 3 weeks on and  one week off. If the abnormal bleeding persists call and schedule saline infusion histogram.

## 2012-08-18 NOTE — Progress Notes (Signed)
Patient has been on Ortho Evra patch for her control of transitional like symptoms. She has done well on it. Her husband had a vasectomy. She is started to use it continuously so she wouldn't have any cycle at  All. Initially she did well but now for the last 7 weeks she's had intermittent spotting which is daily and can be very light or heavy enough to use 2 pads per day.  Exam: Kennon Portela present.Pelvic exam: External: Within normal limits. BUS within normal limits. Vaginal exam: Within normal limits. Cervix: Clean. Uterus: Within normal limits. Adnexa: Within normal limits. Rectovaginal exam: Within normal limits.   Assessment: Dysfunctional uterine bleeding on continuous Ortho Evra patch.  Plan: Endometrial biopsy done. Stop  patch for one week. Then reinitiate patch 3 weeks on and  one week off. If biopsy OK and this resolves the bleeding no further treatment. If abnormal bleeding persists she will call and schedule saline infusion histogram.

## 2012-08-22 ENCOUNTER — Other Ambulatory Visit: Payer: Self-pay | Admitting: *Deleted

## 2012-08-22 MED ORDER — DOXYCYCLINE HYCLATE 100 MG PO TABS: 100.0000 mg | ORAL_TABLET | Freq: Two times a day (BID) | ORAL | Status: DC

## 2012-09-01 ENCOUNTER — Telehealth: Payer: Self-pay | Admitting: *Deleted

## 2012-09-01 DIAGNOSIS — N938 Other specified abnormal uterine and vaginal bleeding: Secondary | ICD-10-CM

## 2012-09-01 NOTE — Telephone Encounter (Signed)
Because of the endometritis I would like for her to take vibramycin 100 mg BID for 7 days if not allergic before the sonohysterogram.

## 2012-09-01 NOTE — Telephone Encounter (Signed)
Pt saw Dr Reece Agar for DUB and did an Endo bx, showed endometritis. She is on Ortho Evra and began spotting again after taking her antibiotic. Dr Reece Agar recommended a sonohystergram if spotting/bleeding continued. Pts husband has had a vasetomy and she was pregnant with [place] before so she is ok with you doing the procedure. I just wanted to give you an FYI before scheduling it.

## 2012-09-05 MED ORDER — DOXYCYCLINE HYCLATE 100 MG PO TABS: 100.0000 mg | ORAL_TABLET | Freq: Two times a day (BID) | ORAL | Status: DC

## 2012-09-05 NOTE — Telephone Encounter (Signed)
LM for pt to call me back KW 

## 2012-09-05 NOTE — Telephone Encounter (Signed)
Informed KW and ABX sent and [facility] order sent KW

## 2012-09-17 ENCOUNTER — Other Ambulatory Visit: Payer: BC Managed Care – PPO

## 2012-09-17 ENCOUNTER — Ambulatory Visit: Payer: BC Managed Care – PPO | Admitting: Gynecology

## 2012-09-24 ENCOUNTER — Other Ambulatory Visit: Payer: BC Managed Care – PPO

## 2012-09-24 ENCOUNTER — Ambulatory Visit: Payer: BC Managed Care – PPO | Admitting: Gynecology

## 2012-10-06 ENCOUNTER — Encounter: Payer: Self-pay | Admitting: Gynecology

## 2012-10-06 ENCOUNTER — Ambulatory Visit (INDEPENDENT_AMBULATORY_CARE_PROVIDER_SITE_OTHER): Payer: BC Managed Care – PPO

## 2012-10-06 ENCOUNTER — Ambulatory Visit (INDEPENDENT_AMBULATORY_CARE_PROVIDER_SITE_OTHER): Payer: BC Managed Care – PPO | Admitting: Gynecology

## 2012-10-06 DIAGNOSIS — N882 Stricture and stenosis of cervix uteri: Secondary | ICD-10-CM

## 2012-10-06 DIAGNOSIS — N949 Unspecified condition associated with female genital organs and menstrual cycle: Secondary | ICD-10-CM

## 2012-10-06 DIAGNOSIS — N83339 Acquired atrophy of ovary and fallopian tube, unspecified side: Secondary | ICD-10-CM

## 2012-10-06 DIAGNOSIS — D252 Subserosal leiomyoma of uterus: Secondary | ICD-10-CM

## 2012-10-06 DIAGNOSIS — R102 Pelvic and perineal pain: Secondary | ICD-10-CM

## 2012-10-06 DIAGNOSIS — N938 Other specified abnormal uterine and vaginal bleeding: Secondary | ICD-10-CM

## 2012-10-06 DIAGNOSIS — D259 Leiomyoma of uterus, unspecified: Secondary | ICD-10-CM

## 2012-10-06 DIAGNOSIS — N711 Chronic inflammatory disease of uterus: Secondary | ICD-10-CM

## 2012-10-06 MED ORDER — LIDOCAINE HCL 1 % IJ SOLN
10.0000 mL | Freq: Once | INTRAMUSCULAR | Status: AC
  Administered 2012-10-06: 10 mL

## 2012-10-06 NOTE — Progress Notes (Signed)
Patient is a 42 year old who has been on Ortho Evra patch prescribed by my partner Dr. Eda Paschal for patient's transitional symptoms whereby she had a borderline FSH of 26. She had complained in the past of her symptoms be more severe the week off the patch.she then begin taking the patch continuously and was seen in the office on December 23. She had stated that initially when she started to continuous Ortho Evra patch for a while and then began having intermenstrual spotting. She says that time she could be heavy that she would need to wear 2 pads per day. On that office visit December 23 she had an endometrial biopsy which demonstrated the following:  Endometrium, biopsy - CHRONIC ENDOMETRITIS. - INTERVAL PHASE ENDOMETRIUM (LATE PROLIFERATIVE / EARLY SECRETORY), WITH DECIDUALIZATION OF THE STROMA, CONSISTENT WITH HORMONE EFFECT. - ENDOCERVICAL TYPE MUCOSA. - THERE IS NO EVIDENCE OF HYPERPLASIA OR MALIGNANCY.  She was placed on fiber myosin 100 mg twice a day which she took for 7 days and was asked to return to the office for sonohysterogram to rule out any intracavitary uterine defect.\  Today's sonohysterogram as follows: Uterus measured 9.1 x 4.6 x 3.8 cm with endometrial stripe of 3.7 mm. Right and left ovary were atrophic. There there was some difficulty in passing the catheter through the endocervical canal. 1% lidocaine was infiltrated at the cervical stroma 248 and 10:00 position for a paracervical block after the cervix was cleansed with Hibiclens solution (patient allergic to Betadine). Small metal dilators were required to dilate the cervical canal in an effort to be able to feed  A small uterine catheter. Once this was accomplished the infusion hysterogram did not demonstrate any intracavitary defect. Her endometrial stripe was 3.7 mm.  Assessment/plan: Patient with breakthrough bleeding on continuous Ortho Evra patch. Previous biopsy demonstrated chronic endometritis and patient was  treated with Vibramycin 100 mg twice a day for one week. Hersonohysterogram was negative. She has started back on a regimen consisting of changing the patch weekly but resting one week to withdrawal. She is scheduled to return in May for her annual exam and I asked her to keep a menstrual calendar.

## 2013-01-08 ENCOUNTER — Encounter: Payer: BC Managed Care – PPO | Admitting: Gynecology

## 2013-01-12 ENCOUNTER — Other Ambulatory Visit: Payer: Self-pay | Admitting: *Deleted

## 2013-01-12 MED ORDER — NORELGESTROMIN-ETH ESTRADIOL 150-35 MCG/24HR TD PTWK: MEDICATED_PATCH | TRANSDERMAL | Status: DC

## 2013-01-14 ENCOUNTER — Encounter: Payer: Self-pay | Admitting: Gynecology

## 2013-01-22 ENCOUNTER — Encounter: Payer: Self-pay | Admitting: Gynecology

## 2013-01-22 ENCOUNTER — Ambulatory Visit (INDEPENDENT_AMBULATORY_CARE_PROVIDER_SITE_OTHER): Payer: BC Managed Care – PPO | Admitting: Gynecology

## 2013-01-22 VITALS — BP 130/86 | Ht 63.5 in | Wt 189.0 lb

## 2013-01-22 DIAGNOSIS — Z01419 Encounter for gynecological examination (general) (routine) without abnormal findings: Secondary | ICD-10-CM

## 2013-01-22 DIAGNOSIS — N951 Menopausal and female climacteric states: Secondary | ICD-10-CM

## 2013-01-22 MED ORDER — NORETHIN ACE-ETH ESTRAD-FE 1-20 MG-MCG PO TABS: 1.0000 | ORAL_TABLET | Freq: Every day | ORAL | Status: DC

## 2013-01-22 MED ORDER — NORETHIN ACE-ETH ESTRAD-FE 1-20 MG-MCG PO TABS: ORAL_TABLET | ORAL | Status: DC

## 2013-01-22 NOTE — Patient Instructions (Addendum)
Tetanus, Diphtheria, Pertussis (Tdap) Vaccine What You Need to Know WHY GET VACCINATED? Tetanus, diphtheria and pertussis can be very serious diseases, even for adolescents and adults. Tdap vaccine can protect us from these diseases. TETANUS (Lockjaw) causes painful muscle tightening and stiffness, usually all over the body.  It can lead to tightening of muscles in the head and neck so you can't open your mouth, swallow, or sometimes even breathe. Tetanus kills about 1 out of 5 people who are infected. DIPHTHERIA can cause a thick coating to form in the back of the throat.  It can lead to breathing problems, paralysis, heart failure, and death. PERTUSSIS (Whooping Cough) causes severe coughing spells, which can cause difficulty breathing, vomiting and disturbed sleep.  It can also lead to weight loss, incontinence, and rib fractures. Up to 2 in 100 adolescents and 5 in 100 adults with pertussis are hospitalized or have complications, which could include pneumonia and death. These diseases are caused by bacteria. Diphtheria and pertussis are spread from person to person through coughing or sneezing. Tetanus enters the body through cuts, scratches, or wounds. Before vaccines, the [place] saw as many as 200,000 cases a year of diphtheria and pertussis, and hundreds of cases of tetanus. Since vaccination began, tetanus and diphtheria have dropped by about 99% and pertussis by about 80%. TDAP VACCINE Tdap vaccine can protect adolescents and adults from tetanus, diphtheria, and pertussis. One dose of Tdap is routinely given at age 11 or 12. People who did not get Tdap at that age should get it as soon as possible. Tdap is especially important for health care professionals and anyone having close contact with a baby younger than 12 months. Pregnant women should get a dose of Tdap during every pregnancy, to protect the newborn from pertussis. Infants are most at risk for severe, life-threatening  complications from pertussis. A similar vaccine, called Td, protects from tetanus and diphtheria, but not pertussis. A Td booster should be given every 10 years. Tdap may be given as one of these boosters if you have not already gotten a dose. Tdap may also be given after a severe cut or burn to prevent tetanus infection. Your doctor can give you more information. Tdap may safely be given at the same time as other vaccines. SOME PEOPLE SHOULD NOT GET THIS VACCINE  If you ever had a life-threatening allergic reaction after a dose of any tetanus, diphtheria, or pertussis containing vaccine, OR if you have a severe allergy to any part of this vaccine, you should not get Tdap. Tell your doctor if you have any severe allergies.  If you had a coma, or long or multiple seizures within 7 days after a childhood dose of DTP or DTaP, you should not get Tdap, unless a cause other than the vaccine was found. You can still get Td.  Talk to your doctor if you:  have epilepsy or another nervous system problem,  had severe pain or swelling after any vaccine containing diphtheria, tetanus or pertussis,  ever had Guillain-Barr Syndrome (GBS),  aren't feeling well on the day the shot is scheduled. RISKS OF A VACCINE REACTION With any medicine, including vaccines, there is a chance of side effects. These are usually mild and go away on their own, but serious reactions are also possible. Brief fainting spells can follow a vaccination, leading to injuries from falling. Sitting or lying down for about 15 minutes can help prevent these. Tell your doctor if you feel dizzy or light-headed, or   have vision changes or ringing in the ears. Mild problems following Tdap (Did not interfere with activities)  Pain where the shot was given (about 3 in 4 adolescents or 2 in 3 adults)  Redness or swelling where the shot was given (about 1 person in 5)  Mild fever of at least 100.34F (up to about 1 in 25 adolescents or 1 in  100 adults)  Headache (about 3 or 4 people in 10)  Tiredness (about 1 person in 3 or 4)  Nausea, vomiting, diarrhea, stomach ache (up to 1 in 4 adolescents or 1 in 10 adults)  Chills, body aches, sore joints, rash, swollen glands (uncommon) Moderate problems following Tdap (Interfered with activities, but did not require medical attention)  Pain where the shot was given (about 1 in 5 adolescents or 1 in 100 adults)  Redness or swelling where the shot was given (up to about 1 in 16 adolescents or 1 in 25 adults)  Fever over 102F (about 1 in 100 adolescents or 1 in 250 adults)  Headache (about 3 in 20 adolescents or 1 in 10 adults)  Nausea, vomiting, diarrhea, stomach ache (up to 1 or 3 people in 100)  Swelling of the entire arm where the shot was given (up to about 3 in 100). Severe problems following Tdap (Unable to perform usual activities, required medical attention)  Swelling, severe pain, bleeding and redness in the arm where the shot was given (rare). A severe allergic reaction could occur after any vaccine (estimated less than 1 in a million doses). WHAT IF THERE IS A SERIOUS REACTION? What should I look for?  Look for anything that concerns you, such as signs of a severe allergic reaction, very high fever, or behavior changes. Signs of a severe allergic reaction can include hives, swelling of the face and throat, difficulty breathing, a fast heartbeat, dizziness, and weakness. These would start a few minutes to a few hours after the vaccination. What should I do?  If you think it is a severe allergic reaction or other emergency that can't wait, call 9-1-1 or get the person to the nearest hospital. Otherwise, call your doctor.  Afterward, the reaction should be reported to the "Vaccine Adverse Event Reporting System" (VAERS). Your doctor might file this report, or you can do it yourself through the VAERS web site at www.vaers.LAgents.no, or by calling 1-859-795-7627. VAERS is  only for reporting reactions. They do not give medical advice.  THE NATIONAL VACCINE INJURY COMPENSATION PROGRAM The National Vaccine Injury Compensation Program (VICP) is a federal program that was created to compensate people who may have been injured by certain vaccines. Persons who believe they may have been injured by a vaccine can learn about the program and about filing a claim by calling 1-956-667-8077 or visiting the VICP website at SpiritualWord.at. HOW CAN I LEARN MORE?  Ask your doctor.  Call your local or state health department.  Contact the Centers for Disease Control and Prevention (CDC):  Call (936)351-7769 or visit CDC's website at PicCapture.uy. CDC Tdap Vaccine VIS (01/03/12) Document Released: 02/12/2012 Document Revised: 05/07/2012 Document Reviewed: 02/12/2012 [employer] Patient Information 2014 [place], [place].   On the oral contraceptive pill remember the following: each pack has 28 tablets. The first 21 are the active tablets and the last seven are placebos. Take the first 21 tablets and discard the last 7 and start the nexst new pack and do the same. Every third pack take all 28 and you will have a period only  4 times a year instead of 12.  Total calcium recommendation is 1200mg  per day and 2,000 units of vitamin D daily.  Remember to get your mammogram!  Have a great summer with the family!

## 2013-01-23 NOTE — Progress Notes (Signed)
Sydney Short 22-Sep-1970 [id number]   History:    42 y.o.  for annual gyn exam who presented to the office for her gynecological examination and had questions about the Ortho Evra patch that had been prescribed by my partner Dr. Eda Paschal who had been previously treating her for suspected early premature menopause with fluctuating FSH. Patient stated that when she was 42 years of age she had some form of dysplasia and was treated with cryotherapy. Her Pap smear since that time have been normal. She has not received the TDap Vaccine as of yet. Patient has not had a baseline mammogram yet. Patient stated that she had colonoscopy 8 years ago which was negative. Her final workup resulted in laparoscopy and endometriosis being identified.  Past medical history,surgical history, family history and social history were all reviewed and documented in the EPIC chart.  Gynecologic History Patient's last menstrual period was 01/08/2013. Contraception: Ortho-Evra patches weekly Last Pap: 2012. Results were: normal Last mammogram: No prior study. Results were: No prior study  Obstetric History OB History   Grav Para Term Preterm Abortions TAB SAB Ect Mult Living   3 2 2  1  1   2      # Outc Date GA Lbr Len/2nd Wgt Sex Del Anes PTL Lv   1 SAB            2 TRM            3 TRM                ROS: A ROS was performed and pertinent positives and negatives are included in the history.  GENERAL: No fevers or chills. HEENT: No change in vision, no earache, sore throat or sinus congestion. NECK: No pain or stiffness. CARDIOVASCULAR: No chest pain or pressure. No palpitations. PULMONARY: No shortness of breath, cough or wheeze. GASTROINTESTINAL: No abdominal pain, nausea, vomiting or diarrhea, melena or bright red blood per rectum. GENITOURINARY: No urinary frequency, urgency, hesitancy or dysuria. MUSCULOSKELETAL: No joint or muscle pain, no back pain, no recent trauma. DERMATOLOGIC: No rash, no itching, no  lesions. ENDOCRINE: No polyuria, polydipsia, no heat or cold intolerance. No recent change in weight. HEMATOLOGICAL: No anemia or easy bruising or bleeding. NEUROLOGIC: No headache, seizures, numbness, tingling or weakness. PSYCHIATRIC: No depression, no loss of interest in normal activity or change in sleep pattern.     Exam: chaperone present  BP 130/86  Ht 5' 3.5" (1.613 m)  Wt 189 lb (85.73 kg)  BMI 32.95 kg/m2  LMP 01/08/2013  Body mass index is 32.95 kg/(m^2).  General appearance : Well developed well nourished female. No acute distress HEENT: Neck supple, trachea midline, no carotid bruits, no thyroidmegaly Lungs: Clear to auscultation, no rhonchi or wheezes, or rib retractions  Heart: Regular rate and rhythm, no murmurs or gallops Breast:Examined in sitting and supine position were symmetrical in appearance, no palpable masses or tenderness,  no skin retraction, no nipple inversion, no nipple discharge, no skin discoloration, no axillary or supraclavicular lymphadenopathy Abdomen: no palpable masses or tenderness, no rebound or guarding Extremities: no edema or skin discoloration or tenderness  Pelvic:  Bartholin, Urethra, Skene Glands: Within normal limits             Vagina: No gross lesions or discharge  Cervix: No gross lesions or discharge  Uterus  anteverted, normal size, shape and consistency, non-tender and mobile  Adnexa  Without masses or tenderness  Anus and perineum  normal  Rectovaginal  normal sphincter tone without palpated masses or tenderness             Hemoccult not indicated     Assessment/Plan:  42 y.o. female for annual exam with no abnormal findings. I did bring up for discussion of the concerns that I had because of her being 42 years of age being on the Ortho Evra patch which has 35 mcg of ethinyl estradiol. I would be concerned that she would be at higher risk for developing a DVT and a pulmonary embolism. She will be switched to Junel 1/20 oral  contraceptive pill. We discussed about continuous oral intake with withdrawal every 3 months so that she will not have a cycle at only 4 times a year to help with her symptoms. Pap smear was not done today the new guidelines were discussed. Her primary physician is Dr. Clarene Duke who is doing her lab work. She is going to be scheduled for a baseline mammogram. Discussed importance of monthly self breast examination. We discussed importance of calcium vitamin D and regular exercise for osteoporosis prevention. Literature formation on the TDAP vaccine was provided.    Ok Edwards MD, 6:34 AM 01/23/2013

## 2013-02-24 ENCOUNTER — Encounter: Payer: Self-pay | Admitting: Gynecology

## 2013-03-20 ENCOUNTER — Telehealth: Payer: Self-pay | Admitting: *Deleted

## 2013-03-20 NOTE — Telephone Encounter (Signed)
Pt informed with the below note, transferred to appointment desk 

## 2013-03-20 NOTE — Telephone Encounter (Signed)
Tell her the OrthEvra patch has too high estrogen content for patients over 35. We may need for her to come to office to discuss options. Perhaps Mirena IUD. If she is having these symtoms she should stop the pill until I see her.

## 2013-03-20 NOTE — Telephone Encounter (Signed)
Left message for pt to call.

## 2013-03-20 NOTE — Telephone Encounter (Signed)
Pt was given Rx for Junel 1/20 on OV 01/22/13 pt has been taking pills for 2 months now and c/o knee pain swelling, hip pain, and cramps while on pills. Pt said this symptoms happened last time she was on pills, pt asked if she could go back on brand OrthEvra patch? Please advise

## 2013-03-31 ENCOUNTER — Ambulatory Visit (INDEPENDENT_AMBULATORY_CARE_PROVIDER_SITE_OTHER): Payer: BC Managed Care – PPO | Admitting: Gynecology

## 2013-03-31 ENCOUNTER — Encounter: Payer: Self-pay | Admitting: Gynecology

## 2013-03-31 VITALS — BP 140/92

## 2013-03-31 DIAGNOSIS — Z309 Encounter for contraceptive management, unspecified: Secondary | ICD-10-CM

## 2013-03-31 MED ORDER — NORELGESTROMIN-ETH ESTRADIOL 150-35 MCG/24HR TD PTWK: 1.0000 | MEDICATED_PATCH | TRANSDERMAL | Status: DC

## 2013-03-31 NOTE — Patient Instructions (Addendum)

## 2013-03-31 NOTE — Progress Notes (Signed)
had questions about the Ortho Evra patch that had been prescribed by my partner Dr. Eda Paschal who had been previously treating her for suspected early premature menopause with fluctuating FSH. I did bring up for discussion of the concerns that I had because of her being 42 years of age being on the Ortho Evra patch which has 35 mcg of ethinyl estradiol. I would be concerned that she would be at higher risk for developing a DVT and a pulmonary embolism. She will be switched to Junel 1/20 oral contraceptive pill. We discussed about continuous oral intake with withdrawal every 3 months so that she will not have a cycle at only 4 times a year to help with her symptoms.  She presents to the office today stating that she does not like the effects of being on oral contraceptive pills and wanted to go back on the Ortho Evra patch. Her blood pressure readings today were 132/90, and 134/92. Her blood pressure was repeated after the lights were turned down she was allowed to rest. She denies any headache or any visual disturbances.  Exam: Heart: Regular rate and rhythm no murmurs or gallops Lungs: Clear to auscultation no rhonchi or wheezes  Neck: No carotid bruits Extremities:no pitting edema  Assessment/plan: I explained to the patient that I felt uncomfortable prescribing the higher estrogen containing Ortho Evra patch with her blood pressure elevation. She did not start a new pack it has been 2 weeks and she's been off. She will maintain a blood pressure log for the rest the week and send it  To me to review by the end of the week. If her blood pressure continues to be elevated she will need to be followed by her family doctor. We discussed alternatives such as progesterone only forms of contraception such as the Mirena IUD.her husband has had a vasectomy

## 2013-05-04 DIAGNOSIS — I209 Angina pectoris, unspecified: Secondary | ICD-10-CM

## 2013-05-04 HISTORY — DX: Angina pectoris, unspecified: I20.9

## 2014-03-16 ENCOUNTER — Other Ambulatory Visit (HOSPITAL_COMMUNITY)
Admission: RE | Admit: 2014-03-16 | Discharge: 2014-03-16 | Disposition: A | Discharge status: Home / Self Care | Payer: BC Managed Care – PPO | Source: Ambulatory Visit | Attending: Gynecology | Admitting: Gynecology

## 2014-03-16 ENCOUNTER — Other Ambulatory Visit: Payer: Self-pay | Admitting: Gynecology

## 2014-03-16 ENCOUNTER — Ambulatory Visit (INDEPENDENT_AMBULATORY_CARE_PROVIDER_SITE_OTHER): Payer: BC Managed Care – PPO

## 2014-03-16 ENCOUNTER — Ambulatory Visit (INDEPENDENT_AMBULATORY_CARE_PROVIDER_SITE_OTHER): Payer: BC Managed Care – PPO | Admitting: Gynecology

## 2014-03-16 ENCOUNTER — Encounter: Payer: Self-pay | Admitting: Gynecology

## 2014-03-16 VITALS — Ht 64.0 in | Wt 192.0 lb

## 2014-03-16 DIAGNOSIS — N942 Vaginismus: Secondary | ICD-10-CM

## 2014-03-16 DIAGNOSIS — N83339 Acquired atrophy of ovary and fallopian tube, unspecified side: Secondary | ICD-10-CM

## 2014-03-16 DIAGNOSIS — N393 Stress incontinence (female) (male): Secondary | ICD-10-CM

## 2014-03-16 DIAGNOSIS — Z01419 Encounter for gynecological examination (general) (routine) without abnormal findings: Secondary | ICD-10-CM

## 2014-03-16 DIAGNOSIS — IMO0002 Reserved for concepts with insufficient information to code with codable children: Secondary | ICD-10-CM

## 2014-03-16 DIAGNOSIS — Z1151 Encounter for screening for human papillomavirus (HPV): Secondary | ICD-10-CM | POA: Insufficient documentation

## 2014-03-16 DIAGNOSIS — L738 Other specified follicular disorders: Secondary | ICD-10-CM

## 2014-03-16 DIAGNOSIS — L739 Follicular disorder, unspecified: Secondary | ICD-10-CM

## 2014-03-16 DIAGNOSIS — L678 Other hair color and hair shaft abnormalities: Secondary | ICD-10-CM

## 2014-03-16 MED ORDER — CEPHALEXIN 500 MG PO CAPS: 500.0000 mg | ORAL_CAPSULE | Freq: Four times a day (QID) | ORAL | Status: DC

## 2014-03-16 NOTE — Patient Instructions (Signed)
Urinary Incontinence Urinary incontinence is the involuntary loss of urine from your bladder. CAUSES  There are many causes of urinary incontinence. They include:  Medicines.  Infections.  Prostatic enlargement, leading to overflow of urine from your bladder.  Surgery.  Neurological diseases.  Emotional factors. SIGNS AND SYMPTOMS Urinary Incontinence can be divided into four types: 1. Urge incontinence. Urge incontinence is the involuntary loss of urine before you have the opportunity to go to the bathroom. There is a sudden urge to void but not enough time to reach a bathroom. 2. Stress incontinence. Stress incontinence is the sudden loss of urine with any activity that forces urine to pass. It is commonly caused by anatomical changes to the pelvis and sphincter areas of your body. 3. Overflow incontinence. Overflow incontinence is the loss of urine from an obstructed opening to your bladder. This results in a backup of urine and a resultant buildup of pressure within the bladder. When the pressure within the bladder exceeds the closing pressure of the sphincter, the urine overflows, which causes incontinence, similar to water overflowing a dam. 4. Total incontinence. Total incontinence is the loss of urine as a result of the inability to store urine within your bladder. DIAGNOSIS  Evaluating the cause of incontinence may require:  A thorough and complete medical and obstetric history.  A complete physical exam.  Laboratory tests such as a urine culture and sensitivities. When additional tests are indicated, they can include:  An ultrasound exam.  Kidney and bladder X-rays.  Cystoscopy. This is an exam of the bladder using a narrow scope.  Urodynamic testing to test the nerve function to the bladder and sphincter areas. TREATMENT  Treatment for urinary incontinence depends on the cause:  For urge incontinence caused by a bacterial infection, antibiotics will be prescribed.  If the urge incontinence is related to medicines you take, your health care provider may have you change the medicine.  For stress incontinence, surgery to re-establish anatomical support to the bladder or sphincter, or both, will often correct the condition.  For overflow incontinence caused by an enlarged prostate, an operation to open the channel through the enlarged prostate will allow the flow of urine out of the bladder. In women with fibroids, a hysterectomy may be recommended.  For total incontinence, surgery on your urinary sphincter may help. An artificial urinary sphincter (an inflatable cuff placed around the urethra) may be required. In women who have developed a hole-like passage between their bladder and vagina (vesicovaginal fistula), surgery to close the fistula often is required. HOME CARE INSTRUCTIONS  Normal daily hygiene and the use of pads or adult diapers that are changed regularly will help prevent odors and skin damage.  Avoid caffeine. It can overstimulate your bladder.  Use the bathroom regularly. Try about every 2-3 hours to go to the bathroom, even if you do not feel the need to do so. Take time to empty your bladder completely. After urinating, wait a minute. Then try to urinate again.  For causes involving nerve dysfunction, keep a log of the medicines you take and a journal of the times you go to the bathroom. SEEK MEDICAL CARE IF:  You experience worsening of pain instead of improvement in pain after your procedure.  Your incontinence becomes worse instead of better. SEE IMMEDIATE MEDICAL CARE IF:  You experience fever or shaking chills.  You are unable to pass your urine.  You have redness spreading into your groin or down into your thighs. MAKE SURE   YOU:   Understand these instructions.   Will watch your condition.  Will get help right away if you are not doing well or get worse. Document Released: 09/20/2004 Document Revised: 06/03/2013 Document  Reviewed: 01/20/2013 [facility] Patient Information 2015 [place], [place]. This information is not intended to replace advice given to you by your health care provider. Make sure you discuss any questions you have with your health care provider. Kegel Exercises The goal of Kegel exercises is to isolate and exercise your pelvic floor muscles. These muscles act as a hammock that supports the rectum, vagina, small intestine, and uterus. As the muscles weaken, the hammock sags and these organs are displaced from their normal positions. Kegel exercises can strengthen your pelvic floor muscles and help you to improve bladder and bowel control, improve sexual response, and help reduce many problems and some discomfort during pregnancy. Kegel exercises can be done anywhere and at any time. HOW TO PERFORM KEGEL EXERCISES 1. Locate your pelvic floor muscles. To do this, squeeze (contract) the muscles that you use when you try to stop the flow of urine. You will feel a tightness in the vaginal area (women) and a tight lift in the rectal area (men and women). 2. When you begin, contract your pelvic muscles tight for 2-5 seconds, then relax them for 2-5 seconds. This is one set. Do 4-5 sets with a short pause in between. 3. Contract your pelvic muscles for 8-10 seconds, then relax them for 8-10 seconds. Do 4-5 sets. If you cannot contract your pelvic muscles for 8-10 seconds, try 5-7 seconds and work your way up to 8-10 seconds. Your goal is 4-5 sets of 10 contractions each day. Keep your stomach, buttocks, and legs relaxed during the exercises. Perform sets of both short and long contractions. Vary your positions. Perform these contractions 3-4 times per day. Perform sets while you are:   Lying in bed in the morning.  Standing at lunch.  Sitting in the late afternoon.  Lying in bed at night. You should do 40-50 contractions per day. Do not perform more Kegel exercises per day than recommended. Overexercising can  cause muscle fatigue. Continue these exercises for for at least 15-20 weeks or as directed by your caregiver. Document Released: 07/30/2012 Document Reviewed: 07/30/2012 [facility] Patient Information 2015 [place]. This information is not intended to replace advice given to you by your health care provider. Make sure you discuss any questions you have with your health care provider.

## 2014-03-16 NOTE — Progress Notes (Signed)
Sydney Short 06-25-1971 [id number]   History:    43 y.o.  for annual gyn exam who was last seen in the office in May 2014. 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 She states that they have ruled out MS and they're working with her  with physical therapy and steroid injection in her sacroiliac joint. She had her first injection last week and said that she is at 20% improvement. She has complained at times of stress urinary incontinence and dyspareunia. She was also concerned about an indurated area on the lateral aspect of her left arm.  Patient has been on Ortho Evra patch which had prescribed by my partner Dr. Cherylann Banas in the past who had been previously treating her for suspected early premature menopause with fluctuating FSH. I did bring up for discussion last year and this year of the concerns that I had because of her being 43 years of age with an estrogen with 35 mcg of ethinly estradiol and placing her at a higher risk for developing a DVT and a pulmonary embolism. She had been prescribed a 20 mcg oral contraceptive pill but decided that she would accept the risk and go on the Ortho Evra patch.Patient stated that when she was 43 years of age she had some form of dysplasia and was treated with cryotherapy. Her Pap smear since that time have been normal.     Past medical history,surgical history, family history and social history were all reviewed and documented in the EPIC chart.  Gynecologic History Patient's last menstrual period was 02/25/2014. Contraception: Ortho-Evra patches weekly Last Pap: 2012. Results were: normal Last mammogram: 2014. Results were: normal  Obstetric History OB History  Gravida Para Term Preterm AB SAB TAB Ectopic Multiple Living  3 2 2  1 1    2     # Outcome Date GA Lbr Len/2nd Weight Sex  Delivery Anes PTL Lv  3 TRM           2 TRM           1 SAB                ROS: A ROS was performed and pertinent positives and negatives are included in the history.  GENERAL: No fevers or chills. HEENT: No change in vision, no earache, sore throat or sinus congestion. NECK: No pain or stiffness. CARDIOVASCULAR: No chest pain or pressure. No palpitations. PULMONARY: No shortness of breath, cough or wheeze. GASTROINTESTINAL: No abdominal pain, nausea, vomiting or diarrhea, melena or bright red blood per rectum. GENITOURINARY: No urinary frequency, urgency, hesitancy or dysuria. MUSCULOSKELETAL: No joint or muscle pain, no back pain, no recent trauma. DERMATOLOGIC: No rash, no itching, no lesions. ENDOCRINE: No polyuria, polydipsia, no heat or cold intolerance. No recent change in weight. HEMATOLOGICAL: No anemia or easy bruising or bleeding. NEUROLOGIC: No headache, seizures, numbness, tingling or weakness. PSYCHIATRIC: No depression, no loss of interest in normal activity or change in sleep pattern.    Left (deltoid) arm subcutaneous indurated area possibly epidermal inclusion cyst some tenderness but no erythema   Exam: chaperone present  Ht 5\' 4"  (1.626 m)  Wt 192 lb (87.091 kg)  BMI 32.94 kg/m2  LMP 02/25/2014  Body mass index is 32.94 kg/(m^2).  General appearance : Well developed well nourished female. No acute distress HEENT: Neck supple,  trachea midline, no carotid bruits, no thyroidmegaly Lungs: Clear to auscultation, no rhonchi or wheezes, or rib retractions  Heart: Regular rate and rhythm, no murmurs or gallops Breast:Examined in sitting and supine position were symmetrical in appearance, no palpable masses or tenderness,  no skin retraction, no nipple inversion, no nipple discharge, no skin discoloration, no axillary or supraclavicular lymphadenopathy Abdomen: no palpable masses or tenderness, no rebound or guarding Extremities: no edema or skin discoloration or  tenderness  Pelvic:  Bartholin, Urethra, Skene Glands: Within normal limits             Vagina: No gross lesions or discharge  Cervix: No gross lesions or discharge  Uterus  anteverted, limited exam due to vaginismus and slightly overweight.  Adnexa  same as above  Anus and perineum  normal   Rectovaginal  normal sphincter tone without palpated masses or tenderness             Hemoccult not indicated   There was no evidence of cystocele or rectocele or uterine prolapse. A Q-tip angle test was done by applying 2% lidocaine gel on a Q-tip and inserting it in the urethra near the urethrovesical angle. On Valsalva Q-tip angle was less than 30. She did not have any incontinence when she was examined in the supine position upon coughing.  Assessment/Plan:  43 y.o. female for annual exam who is currently undergoing neurological evaluation for decreased sensitivity and difficulty with gait by the neurologist and orthopedic team. Patient with some stress urinary incontinence was not demonstrated when she was examined in the office today. No cystocele or rectocele. Q-tip angle test at less than 30 indicating no hypermobility of the urethra. Will await until she completes her treatment if she continues to have incontinence will need to refer to the urologist. She did have minimal sensitivity when her perineum was stroked with a fine Q-tip.  An ultrasound today was done due to some slight vaginismus and results as follows: Uterus measuring 9.1 x 3.6 x 3.5 cm with endometrial stripe of 1.3 mm. Patient had 2 intramural fibroids largest one measuring 13 x 12 mm. Left ear was atrophic with a thick wall cyst measuring 13 x 13 mm consistent with corpus luteum cyst no apparent adnexal mass on the right.  Pap smear was done today. For the subcutaneous possible folliculitis O. be placed on dicloxacillin 500 mg twice a day for 10 days. If no improvement she will contact the office and she will be referred to the  general surgeon. Her PCP has been doing her blood work. Prescription refill was given for the Ortho Evra patch and she fully accept the risk of potential DVT and pulmonary embolism. She was offered alternatives. Diffusion On the importance of calcium vitamin D as well as monthly breast exams.    Note: This dictation was prepared with  Dragon/digital dictation along withSmart phrase technology. Any transcriptional errors that result from this process are unintentional.   Terrance Mass MD, 4:16 PM 03/16/2014

## 2014-03-17 LAB — CYTOLOGY - PAP

## 2014-03-22 ENCOUNTER — Telehealth: Payer: Self-pay | Admitting: *Deleted

## 2014-03-22 DIAGNOSIS — L739 Follicular disorder, unspecified: Secondary | ICD-10-CM

## 2014-03-22 NOTE — Telephone Encounter (Signed)
Yes, thank you.

## 2014-03-22 NOTE — Telephone Encounter (Signed)
FYI Pt was seen on 03/16/14 Dx: with subcutaneous possible folliculitis O. be placed on dicloxacillin 500 mg twice a day for 10 days. Pt took Rx and no relief still painful. Per note she will be referred to the general surgeon.

## 2014-03-22 NOTE — Telephone Encounter (Signed)
Referral placed at [facility]

## 2014-03-23 NOTE — Telephone Encounter (Signed)
Pt received my message regarding the below note.

## 2014-03-23 NOTE — Telephone Encounter (Signed)
Appointment on 04/08/14 @ 8:40 am with Dr.Ramiez left detailed message on voicemail and asked her to call me to confirm she received my message.

## 2014-03-23 NOTE — Telephone Encounter (Signed)
Left message with Marcie Bal at [facility] surgery to call once scheduled appointment.

## 2014-04-08 ENCOUNTER — Encounter (INDEPENDENT_AMBULATORY_CARE_PROVIDER_SITE_OTHER): Payer: Self-pay | Admitting: General Surgery

## 2014-04-08 ENCOUNTER — Ambulatory Visit (INDEPENDENT_AMBULATORY_CARE_PROVIDER_SITE_OTHER): Payer: BC Managed Care – PPO | Admitting: General Surgery

## 2014-04-08 ENCOUNTER — Other Ambulatory Visit (INDEPENDENT_AMBULATORY_CARE_PROVIDER_SITE_OTHER): Payer: Self-pay | Admitting: *Deleted

## 2014-04-08 VITALS — BP 138/86 | HR 82 | Temp 98.6°F | Ht 64.0 in | Wt 184.0 lb

## 2014-04-08 DIAGNOSIS — R229 Localized swelling, mass and lump, unspecified: Secondary | ICD-10-CM

## 2014-04-08 DIAGNOSIS — R2232 Localized swelling, mass and lump, left upper limb: Secondary | ICD-10-CM

## 2014-04-08 NOTE — Progress Notes (Signed)
Patient ID: Sydney Short, female   DOB: 07-01-1971, 43 y.o.   MRN: [id number]  Chief Complaint  Patient presents with  . arm lump    HPI Sydney Short is a 43 y.o. female.  The patient is a 43 year old female who is referred by Dr. Toney Rakes for evaluation of a left upper arm mass. She states she noticed this July while driving off andwith a towel. She states since this time she is continuing with pain to this area while brushing up on it. She states that she has seen several physicians about this mass.  HPI  Past Medical History  Diagnosis Date  . Renal stones   . Conversion disorder     BLACK OUT EPISODES  . Pelvic adhesions   . Endometriosis   . Cervical dysplasia   . Carpal tunnel syndrome     Past Surgical History  Procedure Laterality Date  . Cesarean section    . Laparoscopic cholecystectomy    . Appendectomy    . Pelvic laparoscopy  2003    DIAG LAP WITH LYSIS OF PELVIC ADHESIONS  . Knee surgery  2010    LEFT  . Colposcopy    . Gynecologic cryosurgery      Family History  Problem Relation Age of Onset  . Hypertension Mother   . Hypertension Father   . Hypertension Sister   . Heart disease Maternal Grandmother   . Hypertension Maternal Grandmother   . Hypertension Maternal Grandfather   . Heart disease Paternal Grandfather   . Breast cancer Maternal Aunt 60    Social History History  Substance Use Topics  . Smoking status: Never Smoker   . Smokeless tobacco: Not on file  . Alcohol Use: No    Allergies  Allergen Reactions  . Amoxicillin   . Biaxin [Clarithromycin]   . Contrast Media [Iodinated Diagnostic Agents] Swelling    "IVP DYE"-FACE SWELLING    Current Outpatient Prescriptions  Medication Sig Dispense Refill  . cyclobenzaprine (FLEXERIL) 5 MG tablet Take 5 mg by mouth 3 (three) times daily as needed for muscle spasms.      Marland Kitchen HYDROcodone-acetaminophen (NORCO/VICODIN) 5-325 MG per tablet Take 1-2 tablets by mouth every 6 (six) hours as needed  for moderate pain.      Marland Kitchen lidocaine (LIDODERM) 5 % Place onto the skin.      . naproxen sodium (ANAPROX) 220 MG tablet Take 220 mg by mouth 2 (two) times daily with a meal.      . norelgestromin-ethinyl estradiol (ORTHO EVRA) 150-35 MCG/24HR transdermal patch Place 1 patch onto the skin once a week.  3 patch  12   No current facility-administered medications for this visit.    Review of Systems Review of Systems  Constitutional: Negative.   HENT: Negative.   Respiratory: Negative.   Cardiovascular: Negative.   Gastrointestinal: Negative.   Neurological: Negative.   All other systems reviewed and are negative.   Blood pressure 138/86, pulse 82, temperature 98.6 F (37 C), temperature source Oral, height 5\' 4"  (1.626 m), weight 184 lb (83.462 kg), last menstrual period 02/25/2014.  Physical Exam Physical Exam  Constitutional: She is oriented to person, place, and time. She appears well-developed and well-nourished.  HENT:  Head: Normocephalic and atraumatic.  Eyes: Conjunctivae and EOM are normal. Pupils are equal, round, and reactive to light.  Neck: Normal range of motion. Neck supple.  Cardiovascular: Normal rate, regular rhythm and normal heart sounds.   Pulmonary/Chest: Effort normal and breath sounds  normal.  Musculoskeletal: Normal range of motion.  Neurological: She is alert and oriented to person, place, and time.  Skin: Skin is warm and dry.     2 x 3 cm, ill-defined mass, no erythema or fluctuance.  Psychiatric: She has a normal mood and affect.    Data Reviewed none  Assessment    A 43 year old female with a left upper arm mass     Plan    1. The mass ill-defined and similar to a sebaceous cyst or lipoma. Will proceed with ultrasound. With these results have been reviewed we will call the patient back with results and then proceed from that point.        Rosario Jacks., Jed Kutch 04/08/2014, 9:11 AM

## 2014-04-09 ENCOUNTER — Ambulatory Visit
Admission: RE | Admit: 2014-04-09 | Discharge: 2014-04-09 | Disposition: A | Discharge status: Home / Self Care | Payer: BC Managed Care – PPO | Source: Ambulatory Visit | Attending: General Surgery | Admitting: General Surgery

## 2014-04-09 DIAGNOSIS — R2232 Localized swelling, mass and lump, left upper limb: Secondary | ICD-10-CM

## 2014-04-12 ENCOUNTER — Telehealth (INDEPENDENT_AMBULATORY_CARE_PROVIDER_SITE_OTHER): Payer: Self-pay

## 2014-04-12 NOTE — Telephone Encounter (Signed)
Called and spoke to patient regarding Benign Lipoma on her arm.  Patient states it's causing discomfort and she would like to proceed with surgical excision.  Patient aware I will forward a message to Dr. Rosendo Gros and call patient back with next step.

## 2014-04-12 NOTE — Telephone Encounter (Signed)
Message copied by Ivor Costa on Mon Apr 12, 2014 12:17 PM ------      Message from: Ralene Ok      Created: Fri Apr 09, 2014  3:26 PM       She has a lipoma in her arm which is benign.  Can you call her and let her knwo.  If she wants it out, just have her come back in so we can sched her.            Thanks      AR ------

## 2014-04-13 ENCOUNTER — Telehealth (INDEPENDENT_AMBULATORY_CARE_PROVIDER_SITE_OTHER): Payer: Self-pay

## 2014-04-20 ENCOUNTER — Other Ambulatory Visit: Payer: Self-pay | Admitting: Gynecology

## 2014-04-26 ENCOUNTER — Encounter (HOSPITAL_COMMUNITY): Payer: Self-pay | Admitting: Pharmacy Technician

## 2014-04-26 ENCOUNTER — Encounter (HOSPITAL_COMMUNITY): Payer: Self-pay | Admitting: *Deleted

## 2014-04-26 NOTE — Progress Notes (Addendum)
Sydney Short was seen in [place] 9/8 2014 with complaints of chest pain and syncope.   patient had a cardiac work up an no cardiac issues were found. No causative factor was found.  i have requested test results and discharge notes.

## 2014-04-27 ENCOUNTER — Encounter (HOSPITAL_COMMUNITY): Payer: Self-pay | Admitting: Surgery

## 2014-04-27 ENCOUNTER — Ambulatory Visit (HOSPITAL_COMMUNITY): Payer: BC Managed Care – PPO | Admitting: Anesthesiology

## 2014-04-27 ENCOUNTER — Encounter (HOSPITAL_COMMUNITY): Payer: BC Managed Care – PPO | Admitting: Anesthesiology

## 2014-04-27 ENCOUNTER — Encounter (HOSPITAL_COMMUNITY)
Admission: RE | Disposition: A | Discharge status: Home / Self Care | Payer: Self-pay | Source: Ambulatory Visit | Attending: General Surgery

## 2014-04-27 ENCOUNTER — Ambulatory Visit (HOSPITAL_COMMUNITY)
Admission: RE | Admit: 2014-04-27 | Discharge: 2014-04-27 | Disposition: A | Discharge status: Home / Self Care | Payer: BC Managed Care – PPO | Source: Ambulatory Visit | Attending: General Surgery | Admitting: General Surgery

## 2014-04-27 DIAGNOSIS — Z91041 Radiographic dye allergy status: Secondary | ICD-10-CM | POA: Insufficient documentation

## 2014-04-27 DIAGNOSIS — D1779 Benign lipomatous neoplasm of other sites: Secondary | ICD-10-CM | POA: Insufficient documentation

## 2014-04-27 DIAGNOSIS — F449 Dissociative and conversion disorder, unspecified: Secondary | ICD-10-CM | POA: Insufficient documentation

## 2014-04-27 DIAGNOSIS — Z881 Allergy status to other antibiotic agents status: Secondary | ICD-10-CM | POA: Insufficient documentation

## 2014-04-27 DIAGNOSIS — Z9089 Acquired absence of other organs: Secondary | ICD-10-CM | POA: Diagnosis not present

## 2014-04-27 DIAGNOSIS — R229 Localized swelling, mass and lump, unspecified: Secondary | ICD-10-CM | POA: Diagnosis present

## 2014-04-27 HISTORY — DX: Anesthesia of skin: R20.0

## 2014-04-27 HISTORY — DX: Cardiac murmur, unspecified: R01.1

## 2014-04-27 HISTORY — DX: Angina pectoris, unspecified: I20.9

## 2014-04-27 HISTORY — DX: Irritable bowel syndrome without diarrhea: K58.9

## 2014-04-27 HISTORY — DX: Adverse effect of unspecified anesthetic, initial encounter: T41.45XA

## 2014-04-27 HISTORY — DX: Dorsalgia, unspecified: M54.9

## 2014-04-27 HISTORY — PX: LIPOMA EXCISION: SHX5283

## 2014-04-27 HISTORY — DX: Other complications of anesthesia, initial encounter: T88.59XA

## 2014-04-27 HISTORY — DX: Other specified postprocedural states: Z98.890

## 2014-04-27 HISTORY — DX: Personal history of urinary calculi: Z87.442

## 2014-04-27 HISTORY — DX: Family history of other specified conditions: Z84.89

## 2014-04-27 HISTORY — DX: Nausea with vomiting, unspecified: R11.2

## 2014-04-27 HISTORY — DX: Orthostatic hypotension: I95.1

## 2014-04-27 LAB — CBC
HCT: 41.9 % (ref 36.0–46.0)
Hemoglobin: 13.5 g/dL (ref 12.0–15.0)
MCH: 30.3 pg (ref 26.0–34.0)
MCHC: 32.2 g/dL (ref 30.0–36.0)
MCV: 94.2 fL (ref 78.0–100.0)
PLATELETS: 405 10*3/uL — AB (ref 150–400)
RBC: 4.45 MIL/uL (ref 3.87–5.11)
RDW: 12.9 % (ref 11.5–15.5)
WBC: 7.7 10*3/uL (ref 4.0–10.5)

## 2014-04-27 LAB — HCG, SERUM, QUALITATIVE: PREG SERUM: NEGATIVE

## 2014-04-27 SURGERY — EXCISION LIPOMA
Anesthesia: Monitor Anesthesia Care | Site: Shoulder | Laterality: Left

## 2014-04-27 MED ORDER — ONDANSETRON HCL 4 MG/2ML IJ SOLN
INTRAMUSCULAR | Status: DC | PRN
  Administered 2014-04-27: 4 mg via INTRAVENOUS

## 2014-04-27 MED ORDER — DEXMEDETOMIDINE HCL IN NACL 200 MCG/50ML IV SOLN
INTRAVENOUS | Status: DC | PRN
  Administered 2014-04-27: 0.2 ug/kg/h via INTRAVENOUS

## 2014-04-27 MED ORDER — LACTATED RINGERS IV SOLN
INTRAVENOUS | Status: DC
  Administered 2014-04-27: 10:00:00 via INTRAVENOUS

## 2014-04-27 MED ORDER — BUPIVACAINE-EPINEPHRINE 0.25% -1:200000 IJ SOLN
INTRAMUSCULAR | Status: DC | PRN
  Administered 2014-04-27: 30 mL

## 2014-04-27 MED ORDER — KETAMINE HCL 100 MG/ML IJ SOLN
INTRAMUSCULAR | Status: AC
  Filled 2014-04-27: qty 1

## 2014-04-27 MED ORDER — LACTATED RINGERS IV SOLN
INTRAVENOUS | Status: DC | PRN
  Administered 2014-04-27: 09:00:00 via INTRAVENOUS

## 2014-04-27 MED ORDER — GLYCOPYRROLATE 0.2 MG/ML IJ SOLN
INTRAMUSCULAR | Status: AC
  Filled 2014-04-27: qty 2

## 2014-04-27 MED ORDER — KETAMINE HCL 10 MG/ML IJ SOLN
INTRAMUSCULAR | Status: DC | PRN
  Administered 2014-04-27: 20 mg via INTRAVENOUS

## 2014-04-27 MED ORDER — PROPOFOL 10 MG/ML IV BOLUS
INTRAVENOUS | Status: AC
  Filled 2014-04-27: qty 20

## 2014-04-27 MED ORDER — GLYCOPYRROLATE 0.2 MG/ML IJ SOLN
INTRAMUSCULAR | Status: DC | PRN
  Administered 2014-04-27: 0.6 mg via INTRAVENOUS

## 2014-04-27 MED ORDER — MEPERIDINE HCL 25 MG/ML IJ SOLN: 6.2500 mg | INTRAMUSCULAR | Status: DC | PRN

## 2014-04-27 MED ORDER — ARTIFICIAL TEARS OP OINT
TOPICAL_OINTMENT | OPHTHALMIC | Status: AC
  Filled 2014-04-27: qty 3.5

## 2014-04-27 MED ORDER — FENTANYL CITRATE 0.05 MG/ML IJ SOLN
INTRAMUSCULAR | Status: AC
  Filled 2014-04-27: qty 5

## 2014-04-27 MED ORDER — FENTANYL CITRATE 0.05 MG/ML IJ SOLN: 25.0000 ug | INTRAMUSCULAR | Status: DC | PRN

## 2014-04-27 MED ORDER — ACETAMINOPHEN 10 MG/ML IV SOLN
INTRAVENOUS | Status: AC
Start: 2014-04-27 — End: 2014-04-27
  Filled 2014-04-27: qty 100

## 2014-04-27 MED ORDER — ROCURONIUM BROMIDE 50 MG/5ML IV SOLN
INTRAVENOUS | Status: AC
  Filled 2014-04-27: qty 1

## 2014-04-27 MED ORDER — PROPOFOL 10 MG/ML IV BOLUS
INTRAVENOUS | Status: DC | PRN
  Administered 2014-04-27: 150 mg via INTRAVENOUS

## 2014-04-27 MED ORDER — ROCURONIUM BROMIDE 100 MG/10ML IV SOLN
INTRAVENOUS | Status: DC | PRN
  Administered 2014-04-27: 25 mg via INTRAVENOUS

## 2014-04-27 MED ORDER — PROMETHAZINE HCL 25 MG/ML IJ SOLN
INTRAMUSCULAR | Status: AC
  Filled 2014-04-27: qty 1

## 2014-04-27 MED ORDER — ONDANSETRON HCL 4 MG/2ML IJ SOLN
INTRAMUSCULAR | Status: AC
  Filled 2014-04-27: qty 2

## 2014-04-27 MED ORDER — MIDAZOLAM HCL 5 MG/5ML IJ SOLN
INTRAMUSCULAR | Status: DC | PRN
  Administered 2014-04-27: 2 mg via INTRAVENOUS

## 2014-04-27 MED ORDER — BUPIVACAINE-EPINEPHRINE (PF) 0.25% -1:200000 IJ SOLN
INTRAMUSCULAR | Status: AC
  Filled 2014-04-27: qty 30

## 2014-04-27 MED ORDER — MIDAZOLAM HCL 2 MG/2ML IJ SOLN
INTRAMUSCULAR | Status: AC
  Filled 2014-04-27: qty 2

## 2014-04-27 MED ORDER — VANCOMYCIN HCL IN DEXTROSE 1-5 GM/200ML-% IV SOLN
1000.0000 mg | Freq: Once | INTRAVENOUS | Status: AC
  Administered 2014-04-27: 1000 mg via INTRAVENOUS
  Filled 2014-04-27: qty 200

## 2014-04-27 MED ORDER — ACETAMINOPHEN 10 MG/ML IV SOLN
INTRAVENOUS | Status: DC | PRN
  Administered 2014-04-27: 1000 mg via INTRAVENOUS

## 2014-04-27 MED ORDER — LIDOCAINE HCL (CARDIAC) 20 MG/ML IV SOLN
INTRAVENOUS | Status: DC | PRN
  Administered 2014-04-27: 50 mg via INTRAVENOUS

## 2014-04-27 MED ORDER — NEOSTIGMINE METHYLSULFATE 10 MG/10ML IV SOLN
INTRAVENOUS | Status: DC | PRN
  Administered 2014-04-27: 4 mg via INTRAVENOUS

## 2014-04-27 MED ORDER — DEXAMETHASONE SODIUM PHOSPHATE 4 MG/ML IJ SOLN
INTRAMUSCULAR | Status: DC | PRN
  Administered 2014-04-27: 4 mg via INTRAVENOUS

## 2014-04-27 MED ORDER — PROMETHAZINE HCL 25 MG/ML IJ SOLN
6.2500 mg | INTRAMUSCULAR | Status: DC | PRN
  Administered 2014-04-27: 6.25 mg via INTRAVENOUS

## 2014-04-27 MED ORDER — NEOSTIGMINE METHYLSULFATE 10 MG/10ML IV SOLN
INTRAVENOUS | Status: AC
  Filled 2014-04-27: qty 1

## 2014-04-27 MED ORDER — OXYCODONE-ACETAMINOPHEN 5-325 MG PO TABS: 1.0000 | ORAL_TABLET | ORAL | Status: DC | PRN

## 2014-04-27 MED ORDER — SODIUM CHLORIDE 0.9 % IJ SOLN
INTRAMUSCULAR | Status: AC
  Filled 2014-04-27: qty 10

## 2014-04-27 MED ORDER — FENTANYL CITRATE 0.05 MG/ML IJ SOLN
INTRAMUSCULAR | Status: DC | PRN
  Administered 2014-04-27: 100 ug via INTRAVENOUS
  Administered 2014-04-27 (×3): 50 ug via INTRAVENOUS

## 2014-04-27 SURGICAL SUPPLY — 36 items
BLADE SURG ROTATE 9660 (MISCELLANEOUS) IMPLANT
COVER SURGICAL LIGHT HANDLE (MISCELLANEOUS) ×3 IMPLANT
DERMABOND ADVANCED (GAUZE/BANDAGES/DRESSINGS) ×2
DERMABOND ADVANCED .7 DNX12 (GAUZE/BANDAGES/DRESSINGS) ×1 IMPLANT
DRAPE ORTHO SPLIT 77X108 STRL (DRAPES)
DRAPE PED LAPAROTOMY (DRAPES) IMPLANT
DRAPE SURG ORHT 6 SPLT 77X108 (DRAPES) IMPLANT
ELECT CAUTERY BLADE 6.4 (BLADE) ×3 IMPLANT
ELECT REM PT RETURN 9FT ADLT (ELECTROSURGICAL) ×3
ELECTRODE REM PT RTRN 9FT ADLT (ELECTROSURGICAL) ×1 IMPLANT
GAUZE SPONGE 4X4 12PLY STRL (GAUZE/BANDAGES/DRESSINGS) IMPLANT
GLOVE BIO SURGEON STRL SZ7 (GLOVE) ×3 IMPLANT
GLOVE BIO SURGEON STRL SZ7.5 (GLOVE) ×3 IMPLANT
GLOVE BIOGEL PI IND STRL 7.0 (GLOVE) ×1 IMPLANT
GLOVE BIOGEL PI IND STRL 8 (GLOVE) ×1 IMPLANT
GLOVE BIOGEL PI INDICATOR 7.0 (GLOVE) ×2
GLOVE BIOGEL PI INDICATOR 8 (GLOVE) ×2
GOWN STRL REUS W/ TWL LRG LVL3 (GOWN DISPOSABLE) ×2 IMPLANT
GOWN STRL REUS W/ TWL XL LVL3 (GOWN DISPOSABLE) ×1 IMPLANT
GOWN STRL REUS W/TWL LRG LVL3 (GOWN DISPOSABLE) ×4
GOWN STRL REUS W/TWL XL LVL3 (GOWN DISPOSABLE) ×2
KIT BASIN OR (CUSTOM PROCEDURE TRAY) ×3 IMPLANT
KIT ROOM TURNOVER OR (KITS) ×3 IMPLANT
NEEDLE 25GX 5/8IN NON SAFETY (NEEDLE) ×3 IMPLANT
NS IRRIG 1000ML POUR BTL (IV SOLUTION) ×3 IMPLANT
PACK SURGICAL SETUP 50X90 (CUSTOM PROCEDURE TRAY) ×3 IMPLANT
PAD ARMBOARD 7.5X6 YLW CONV (MISCELLANEOUS) ×3 IMPLANT
PENCIL BUTTON HOLSTER BLD 10FT (ELECTRODE) ×3 IMPLANT
SPECIMEN JAR SMALL (MISCELLANEOUS) ×3 IMPLANT
SPONGE LAP 18X18 X RAY DECT (DISPOSABLE) ×3 IMPLANT
SUT MNCRL AB 4-0 PS2 18 (SUTURE) ×3 IMPLANT
SUT VIC AB 3-0 SH 8-18 (SUTURE) ×3 IMPLANT
SYR CONTROL 10ML LL (SYRINGE) ×3 IMPLANT
TOWEL OR 17X24 6PK STRL BLUE (TOWEL DISPOSABLE) ×3 IMPLANT
TOWEL OR 17X26 10 PK STRL BLUE (TOWEL DISPOSABLE) ×3 IMPLANT
UNDERPAD 30X30 INCONTINENT (UNDERPADS AND DIAPERS) IMPLANT

## 2014-04-27 NOTE — Transfer of Care (Signed)
Immediate Anesthesia Transfer of Care Note  Patient: Sydney Short  Procedure(s) Performed: Procedure(s): EXCISION OF LEFT SHOULDER MASS (Left)  Patient Location: PACU  Anesthesia Type:General  Level of Consciousness: awake, alert  and oriented  Airway & Oxygen Therapy: Patient Spontanous Breathing and Patient connected to face mask oxygen  Post-op Assessment: Report given to PACU RN  Post vital signs: Reviewed and stable  Complications: No apparent anesthesia complications

## 2014-04-27 NOTE — Op Note (Signed)
04/27/2014  11:15 AM  PATIENT:  Sydney Short  43 y.o. female  PRE-OPERATIVE DIAGNOSIS:  Left Shoulder Mass  POST-OPERATIVE DIAGNOSIS:  Left Shoulder Mass 4.5 x 5cm sub-q mass  PROCEDURE:  Procedure(s): EXCISION OF LEFT SHOULDER MASS (Left)  SURGEON:  Surgeon(s) and Role:    * Ralene Ok, MD - Primary  ASSISTANTS: none   ANESTHESIA:   local and general  EBL:  Total I/O In: 700 [I.V.:700] Out: 5 [Blood:5]  BLOOD ADMINISTERED:none  DRAINS: none   LOCAL MEDICATIONS USED:  BUPIVICAINE   SPECIMEN:  Source of Specimen:  Should mass  DISPOSITION OF SPECIMEN:  PATHOLOGY  COUNTS:  YES  TOURNIQUET:  * No tourniquets in log *  DICTATION: .Dragon Dictation After the patient was consented she was taken back to the OR and placed in the lateral position.  She was prepped and draped in the usual sterile fashion.  A time out was called and all facts were verified.  A 4cm incision was made over the area of the mass. Electrocautery was used to maintain hemostasis and dissection was taken down to the mass.  The was bluntly dissected from the surrounding tissue.  The mass was delivered into the wound.  The was excised en total and measured to be 4.5x5cm in the subcutaneous tissue.  The area was irrigated with sterile saline.  A 3-0 vicryl was used to reapproximate the deep dermal tissue in an interrupted fashion.  A 4-0 monocryl was used to reapproximate the skin.  The skin was dressed with Dermabond.  The patient tolerated the procedure well and was taken the PACU in stable condition.  PLAN OF CARE: Discharge to home after PACU  PATIENT DISPOSITION:  PACU - hemodynamically stable.   Delay start of Pharmacological VTE agent (>24hrs) due to surgical blood loss or risk of bleeding: not applicable

## 2014-04-27 NOTE — H&P (View-Only) (Signed)
Patient ID: Sydney Short, female   DOB: 01-Oct-1970, 43 y.o.   MRN: [id number]  Chief Complaint  Patient presents with  . arm lump    HPI Sydney Short is a 43 y.o. female.  The patient is a 43 year old female who is referred by Dr. Toney Rakes for evaluation of a left upper arm mass. She states she noticed this July while driving off andwith a towel. She states since this time she is continuing with pain to this area while brushing up on it. She states that she has seen several physicians about this mass.  HPI  Past Medical History  Diagnosis Date  . Renal stones   . Conversion disorder     BLACK OUT EPISODES  . Pelvic adhesions   . Endometriosis   . Cervical dysplasia   . Carpal tunnel syndrome     Past Surgical History  Procedure Laterality Date  . Cesarean section    . Laparoscopic cholecystectomy    . Appendectomy    . Pelvic laparoscopy  2003    DIAG LAP WITH LYSIS OF PELVIC ADHESIONS  . Knee surgery  2010    LEFT  . Colposcopy    . Gynecologic cryosurgery      Family History  Problem Relation Age of Onset  . Hypertension Mother   . Hypertension Father   . Hypertension Sister   . Heart disease Maternal Grandmother   . Hypertension Maternal Grandmother   . Hypertension Maternal Grandfather   . Heart disease Paternal Grandfather   . Breast cancer Maternal Aunt 60    Social History History  Substance Use Topics  . Smoking status: Never Smoker   . Smokeless tobacco: Not on file  . Alcohol Use: No    Allergies  Allergen Reactions  . Amoxicillin   . Biaxin [Clarithromycin]   . Contrast Media [Iodinated Diagnostic Agents] Swelling    "IVP DYE"-FACE SWELLING    Current Outpatient Prescriptions  Medication Sig Dispense Refill  . cyclobenzaprine (FLEXERIL) 5 MG tablet Take 5 mg by mouth 3 (three) times daily as needed for muscle spasms.      Marland Kitchen HYDROcodone-acetaminophen (NORCO/VICODIN) 5-325 MG per tablet Take 1-2 tablets by mouth every 6 (six) hours as needed  for moderate pain.      Marland Kitchen lidocaine (LIDODERM) 5 % Place onto the skin.      . naproxen sodium (ANAPROX) 220 MG tablet Take 220 mg by mouth 2 (two) times daily with a meal.      . norelgestromin-ethinyl estradiol (ORTHO EVRA) 150-35 MCG/24HR transdermal patch Place 1 patch onto the skin once a week.  3 patch  12   No current facility-administered medications for this visit.    Review of Systems Review of Systems  Constitutional: Negative.   HENT: Negative.   Respiratory: Negative.   Cardiovascular: Negative.   Gastrointestinal: Negative.   Neurological: Negative.   All other systems reviewed and are negative.   Blood pressure 138/86, pulse 82, temperature 98.6 F (37 C), temperature source Oral, height 5\' 4"  (1.626 m), weight 184 lb (83.462 kg), last menstrual period 02/25/2014.  Physical Exam Physical Exam  Constitutional: She is oriented to person, place, and time. She appears well-developed and well-nourished.  HENT:  Head: Normocephalic and atraumatic.  Eyes: Conjunctivae and EOM are normal. Pupils are equal, round, and reactive to light.  Neck: Normal range of motion. Neck supple.  Cardiovascular: Normal rate, regular rhythm and normal heart sounds.   Pulmonary/Chest: Effort normal and breath sounds  normal.  Musculoskeletal: Normal range of motion.  Neurological: She is alert and oriented to person, place, and time.  Skin: Skin is warm and dry.     2 x 3 cm, ill-defined mass, no erythema or fluctuance.  Psychiatric: She has a normal mood and affect.    Data Reviewed none  Assessment    A 43 year old female with a left upper arm mass     Plan    1. The mass ill-defined and similar to a sebaceous cyst or lipoma. Will proceed with ultrasound. With these results have been reviewed we will call the patient back with results and then proceed from that point.        Rosario Jacks., Shantoria Ellwood 04/08/2014, 9:11 AM

## 2014-04-27 NOTE — Anesthesia Preprocedure Evaluation (Addendum)
Anesthesia Evaluation   Patient awake    Reviewed: Allergy & Precautions, H&P , NPO status   Airway Mallampati: II TM Distance: >3 FB Neck ROM: Full    Dental  (+) Teeth Intact   Pulmonary  breath sounds clear to auscultation        Cardiovascular Rhythm:Regular Rate:Normal     Neuro/Psych    GI/Hepatic   Endo/Other    Renal/GU      Musculoskeletal   Abdominal   Peds  Hematology   Anesthesia Other Findings   Reproductive/Obstetrics                          Anesthesia Physical Anesthesia Plan  ASA: II  Anesthesia Plan: General and MAC   Post-op Pain Management:    Induction: Intravenous  Airway Management Planned: LMA and Oral ETT  Additional Equipment:   Intra-op Plan:   Post-operative Plan:   Informed Consent: I have reviewed the patients History and Physical, chart, labs and discussed the procedure including the risks, benefits and alternatives for the proposed anesthesia with the patient or authorized representative who has indicated his/her understanding and acceptance.     Plan Discussed with:   Anesthesia Plan Comments:         Anesthesia Quick Evaluation

## 2014-04-27 NOTE — Anesthesia Postprocedure Evaluation (Signed)
  Anesthesia Post-op Note  Patient: Sydney Short  Procedure(s) Performed: Procedure(s): EXCISION OF LEFT SHOULDER MASS (Left)  Patient Location: PACU  Anesthesia Type:General  Level of Consciousness: awake, alert  and oriented  Airway and Oxygen Therapy: Patient Spontanous Breathing and Patient connected to nasal cannula oxygen  Post-op Pain: mild  Post-op Assessment: Post-op Vital signs reviewed, Patient's Cardiovascular Status Stable, Respiratory Function Stable, Patent Airway and No signs of Nausea or vomiting  Post-op Vital Signs: Reviewed and stable  Last Vitals:  Filed Vitals:   04/27/14 1230  BP:   Pulse: 90  Temp:   Resp: 18    Complications: No apparent anesthesia complications

## 2014-04-27 NOTE — Discharge Instructions (Signed)
Lipoma A lipoma is a noncancerous (benign) tumor composed of fat cells. They are usually found under the skin (subcutaneous). A lipoma may occur in any tissue of the body that contains fat. Common areas for lipomas to appear include the back, shoulders, buttocks, and thighs. Lipomas are a very common soft tissue growth. They are soft and grow slowly. Most problems caused by a lipoma depend on where it is growing. DIAGNOSIS  A lipoma can be diagnosed with a physical exam. These tumors rarely become cancerous, but radiographic studies can help determine this for certain. Studies used may include:  Computerized X-ray scans (CT or CAT scan).  Computerized magnetic scans (MRI). TREATMENT  Small lipomas that are not causing problems may be watched. If a lipoma continues to enlarge or causes problems, removal is often the best treatment. Lipomas can also be removed to improve appearance. Surgery is done to remove the fatty cells and the surrounding capsule. Most often, this is done with medicine that numbs the area (local anesthetic). The removed tissue is examined under a microscope to make sure it is not cancerous. Keep all follow-up appointments with your caregiver. SEEK MEDICAL CARE IF:   The lipoma becomes larger or hard.  The lipoma becomes painful, red, or increasingly swollen. These could be signs of infection or a more serious condition. Document Released: 08/03/2002 Document Revised: 11/05/2011 Document Reviewed: 01/13/2010 [facility] Patient Information 2015 [place], [place]. This information is not intended to replace advice given to you by your health care provider. Make sure you discuss any questions you have with your health care provider. What to eat:  For your first meals, you should eat lightly; only small meals initially.  If you do not have nausea, you may eat larger meals.  Avoid spicy, greasy and heavy food.    General Anesthesia, Adult, Care After  Refer to this sheet in the next  few weeks. These instructions provide you with information on caring for yourself after your procedure. Your health care provider may also give you more specific instructions. Your treatment has been planned according to current medical practices, but problems sometimes occur. Call your health care provider if you have any problems or questions after your procedure.  WHAT TO EXPECT AFTER THE PROCEDURE  After the procedure, it is typical to experience:  Sleepiness.  Nausea and vomiting. HOME CARE INSTRUCTIONS  For the first 24 hours after general anesthesia:  Have a responsible person with you.  Do not drive a car. If you are alone, do not take public transportation.  Do not drink alcohol.  Do not take medicine that has not been prescribed by your health care provider.  Do not sign important papers or make important decisions.  You may resume a normal diet and activities as directed by your health care provider.  Change bandages (dressings) as directed.  If you have questions or problems that seem related to general anesthesia, call the hospital and ask for the anesthetist or anesthesiologist on call. SEEK MEDICAL CARE IF:  You have nausea and vomiting that continue the day after anesthesia.  You develop a rash. SEEK IMMEDIATE MEDICAL CARE IF:  You have difficulty breathing.  You have chest pain.  You have any allergic problems. Document Released: 11/19/2000 Document Revised: 04/15/2013 Document Reviewed: 02/26/2013  [facility] Patient Information 2014 [place], [place].   Tissue Adhesive Wound Care    Some cuts and wounds can be closed with tissue adhesive. Adhesive is like glue. It holds the skin together and helps  a wound heal faster. This adhesive goes away on its own as the wound heals.  HOME CARE  Showers are allowed. Do not soak the wound in water. Do not take baths, swim, or use hot tubs. Do not use soaps or creams on your wound.  If a bandage (dressing) was put on, change it as  often as told by your doctor.  Keep the bandage dry.  Do not scratch, pick, or rub the adhesive.  Do not put tape over the adhesive. The adhesive could come off.  Protect the wound from another injury.  Protect the wound from sun and tanning beds.  Only take medicine as told by your doctor.  Keep all doctor visits as told. GET HELP RIGHT AWAY IF:  Your wound is red, puffy (swollen), hot, or tender.  You get a rash after the glue is put on.  You have more pain in the wound.  You have a red streak going away from the wound.  You have yellowish-white fluid (pus) coming from the wound.  You have more bleeding.  You have a fever.  You have chills and start to shake.  You notice a bad smell coming from the wound.  Your wound or adhesive breaks open. MAKE SURE YOU:  Understand these instructions.  Will watch your condition.  Will get help right away if you are not doing well or get worse. Document Released: 05/22/2008 Document Revised: 06/03/2013 Document Reviewed: 03/04/2013  [facility] Patient Information 2015 [place], [place]. This information is not intended to replace advice given to you by your health care provider. Make sure you discuss any questions you have with your health care provider.

## 2014-04-27 NOTE — Interval H&P Note (Signed)
History and Physical Interval Note:  04/27/2014 7:16 AM  Sydney Short  has presented today for surgery, with the diagnosis of Left Shoulder Mass  The various methods of treatment have been discussed with the patient and family. After consideration of risks, benefits and other options for treatment, the patient has consented to  Procedure(s): EXCISION OF LEFT SHOULDER LIPOMA (Left) as a surgical intervention .  The patient's history has been reviewed, patient examined, no change in status, stable for surgery.  I have reviewed the patient's chart and labs.  Questions were answered to the patient's satisfaction.     Rosario Jacks., Anne Hahn

## 2014-04-27 NOTE — Progress Notes (Signed)
Report to S. Gregson RN Pt ready to go to phase 2.

## 2014-04-29 ENCOUNTER — Encounter (HOSPITAL_COMMUNITY): Payer: Self-pay | Admitting: General Surgery

## 2014-04-29 NOTE — Telephone Encounter (Signed)
Erroneous encounter

## 2014-06-28 ENCOUNTER — Encounter (HOSPITAL_COMMUNITY): Payer: Self-pay | Admitting: General Surgery

## 2015-03-21 ENCOUNTER — Ambulatory Visit (INDEPENDENT_AMBULATORY_CARE_PROVIDER_SITE_OTHER): Payer: BC Managed Care – PPO | Admitting: Gynecology

## 2015-03-21 ENCOUNTER — Encounter: Payer: Self-pay | Admitting: Gynecology

## 2015-03-21 VITALS — BP 134/86 | Ht 64.0 in | Wt 182.0 lb

## 2015-03-21 DIAGNOSIS — Z01419 Encounter for gynecological examination (general) (routine) without abnormal findings: Secondary | ICD-10-CM

## 2015-03-21 NOTE — Progress Notes (Signed)
Sydney Short 03/25/1971 [id number]   History:    44 y.o.  for annual gyn exam has had issues with radiculopathy ranging from back to her leg with difficulty walking and has seen several orthopedic specialist and neurologist. She states that they have ruled out MS. the [facility] specialist has diagnosed her with CRPS (chronic regional pain syndrome) and has undergone spinal injections a sterile [place] the past and now she is in the process of receiving laser ablation. She's had some occasional hot flashes.  Patient has been on Ortho Evra patch which had prescribed by my partner Dr. Cherylann Banas in the past who had been previously treating her for suspected early premature menopause with fluctuating FSH. I did bring up for discussion last year and this year of the concerns that I had because of her being 44 years of age with an estrogen with 35 mcg of ethinly estradiol and placing her at a higher risk for developing a DVT and a pulmonary embolism. She had been prescribed a 20 mcg oral contraceptive pill but decided that she would accept the risk and go on the Ortho Evra patch.Patient stated that when she was 44 years of age she had some form of dysplasia and was treated with cryotherapy. Her Pap smear since that time have been normal.    Past medical history,surgical history, family history and social history were all reviewed and documented in the EPIC chart.  Gynecologic History Patient's last menstrual period was 02/24/2015. Contraception: Ortho-Evra patches weekly Last Pap: 2015. Results were: normal Last mammogram: 2015. Results were: normal  Obstetric History OB History  Gravida Para Term Preterm AB SAB TAB Ectopic Multiple Living  3 2 2  1 1    2     # Outcome Date GA Lbr Len/2nd Weight Sex Delivery Anes PTL Lv  3 Term           2 Term           1 SAB                ROS: A ROS was performed and pertinent positives and negatives are included in the history.  GENERAL: No fevers or chills.  HEENT: No change in vision, no earache, sore throat or sinus congestion. NECK: No pain or stiffness. CARDIOVASCULAR: No chest pain or pressure. No palpitations. PULMONARY: No shortness of breath, cough or wheeze. GASTROINTESTINAL: No abdominal pain, nausea, vomiting or diarrhea, melena or bright red blood per rectum. GENITOURINARY: No urinary frequency, urgency, hesitancy or dysuria. MUSCULOSKELETAL: No joint or muscle pain, no back pain, no recent trauma. DERMATOLOGIC: No rash, no itching, no lesions. ENDOCRINE: No polyuria, polydipsia, no heat or cold intolerance. No recent change in weight. HEMATOLOGICAL: No anemia or easy bruising or bleeding. NEUROLOGIC: No headache, seizures, numbness, tingling or weakness. PSYCHIATRIC: No depression, no loss of interest in normal activity or change in sleep pattern.     Exam: chaperone present  BP 134/86 mmHg  Ht 5\' 4"  (1.626 m)  Wt 182 lb (82.555 kg)  BMI 31.22 kg/m2  LMP 02/24/2015  Body mass index is 31.22 kg/(m^2).  General appearance : Well developed well nourished female. No acute distress HEENT: Eyes: no retinal hemorrhage or exudates,  Neck supple, trachea midline, no carotid bruits, no thyroidmegaly Lungs: Clear to auscultation, no rhonchi or wheezes, or rib retractions  Heart: Regular rate and rhythm, no murmurs or gallops Breast:Examined in sitting and supine position were symmetrical in appearance, no palpable masses or tenderness,  no skin retraction, no nipple inversion, no nipple discharge, no skin discoloration, no axillary or supraclavicular lymphadenopathy Abdomen: no palpable masses or tenderness, no rebound or guarding Extremities: no edema or skin discoloration or tenderness  Pelvic:  Bartholin, Urethra, Skene Glands: Within normal limits             Vagina: No gross lesions or discharge  Cervix: No gross lesions or discharge  Uterus  anteverted, normal size, shape and consistency, non-tender and mobile  Adnexa  Without masses  or tenderness  Anus and perineum  normal   Rectovaginal  normal sphincter tone without palpated masses or tenderness             Hemoccult not indicated     Assessment/Plan:  44 y.o. female for annual exam with chronic regional pain syndrome been evaluated and treated at [facility]. She is also currently undergoing occupational therapy as well. Her PCP has been doing her blood work.Prescription refill was given for the Ortho Evra patch and she fully accept the risk of potential DVT and pulmonary embolism. She was offered alternatives. We discussed importance of calcium vitamin D and regular exercise for osteoporosis prevention. Pap smear not indicated this year. She was reminded to schedule her mammogram next year and to do her monthly breast exams.   Terrance Mass MD, 12:02 PM 03/21/2015

## 2015-03-23 ENCOUNTER — Other Ambulatory Visit: Payer: Self-pay | Admitting: Gynecology

## 2015-03-23 MED ORDER — NORELGESTROMIN-ETH ESTRADIOL 150-35 MCG/24HR TD PTWK
1.0000 | MEDICATED_PATCH | TRANSDERMAL | Status: DC
Start: 2015-03-23 — End: 2015-12-02

## 2015-03-28 ENCOUNTER — Other Ambulatory Visit: Payer: BC Managed Care – PPO

## 2015-03-28 DIAGNOSIS — Z01419 Encounter for gynecological examination (general) (routine) without abnormal findings: Secondary | ICD-10-CM

## 2015-03-28 LAB — COMPREHENSIVE METABOLIC PANEL
ALT: 21 U/L (ref 6–29)
AST: 23 U/L (ref 10–30)
Albumin: 4 g/dL (ref 3.6–5.1)
Alkaline Phosphatase: 70 U/L (ref 33–115)
BUN: 11 mg/dL (ref 7–25)
CO2: 28 mmol/L (ref 20–31)
Calcium: 9.8 mg/dL (ref 8.6–10.2)
Chloride: 103 mmol/L (ref 98–110)
Creat: 0.7 mg/dL (ref 0.50–1.10)
GLUCOSE: 84 mg/dL (ref 65–99)
Potassium: 4.6 mmol/L (ref 3.5–5.3)
SODIUM: 139 mmol/L (ref 135–146)
Total Bilirubin: 0.4 mg/dL (ref 0.2–1.2)
Total Protein: 7.3 g/dL (ref 6.1–8.1)

## 2015-03-28 LAB — CBC WITH DIFFERENTIAL/PLATELET
Basophils Absolute: 0.1 10*3/uL (ref 0.0–0.1)
Basophils Relative: 1 % (ref 0–1)
Eosinophils Absolute: 0.2 10*3/uL (ref 0.0–0.7)
Eosinophils Relative: 4 % (ref 0–5)
HEMATOCRIT: 38.1 % (ref 36.0–46.0)
HEMOGLOBIN: 12.8 g/dL (ref 12.0–15.0)
LYMPHS PCT: 34 % (ref 12–46)
Lymphs Abs: 2 10*3/uL (ref 0.7–4.0)
MCH: 30 pg (ref 26.0–34.0)
MCHC: 33.6 g/dL (ref 30.0–36.0)
MCV: 89.2 fL (ref 78.0–100.0)
MONO ABS: 0.5 10*3/uL (ref 0.1–1.0)
MONOS PCT: 8 % (ref 3–12)
MPV: 8.7 fL (ref 8.6–12.4)
Neutro Abs: 3.1 10*3/uL (ref 1.7–7.7)
Neutrophils Relative %: 53 % (ref 43–77)
Platelets: 409 10*3/uL — ABNORMAL HIGH (ref 150–400)
RBC: 4.27 MIL/uL (ref 3.87–5.11)
RDW: 13.2 % (ref 11.5–15.5)
WBC: 5.8 10*3/uL (ref 4.0–10.5)

## 2015-03-28 LAB — TSH: TSH: 1.214 u[IU]/mL (ref 0.350–4.500)

## 2015-03-28 LAB — LIPID PANEL
CHOLESTEROL: 217 mg/dL — AB (ref 125–200)
HDL: 50 mg/dL (ref >=46)
Total CHOL/HDL Ratio: 4.3 Ratio (ref <=5.0)
Triglycerides: 495 mg/dL — ABNORMAL HIGH (ref <150)

## 2015-03-29 LAB — URINALYSIS W MICROSCOPIC + REFLEX CULTURE
Bilirubin Urine: NEGATIVE
CASTS: NONE SEEN [LPF]
CRYSTALS: NONE SEEN [HPF]
Glucose, UA: NEGATIVE
Ketones, ur: NEGATIVE
LEUKOCYTES UA: NEGATIVE
Nitrite: NEGATIVE
PROTEIN: NEGATIVE
Specific Gravity, Urine: 1.013 (ref 1.001–1.035)
Squamous Epithelial / LPF: NONE SEEN [HPF] (ref <=5)
Yeast: NONE SEEN [HPF]
pH: 6.5 (ref 5.0–8.0)

## 2015-03-30 ENCOUNTER — Other Ambulatory Visit: Payer: Self-pay | Admitting: Gynecology

## 2015-03-30 MED ORDER — NITROFURANTOIN MONOHYD MACRO 100 MG PO CAPS: 100.0000 mg | ORAL_CAPSULE | Freq: Two times a day (BID) | ORAL | Status: DC

## 2015-03-31 LAB — URINE CULTURE: Colony Count: 80000

## 2015-10-26 HISTORY — PX: OTHER SURGICAL HISTORY: SHX169

## 2015-12-02 ENCOUNTER — Other Ambulatory Visit: Payer: Self-pay

## 2015-12-02 MED ORDER — NORELGESTROMIN-ETH ESTRADIOL 150-35 MCG/24HR TD PTWK: 1.0000 | MEDICATED_PATCH | TRANSDERMAL | Status: DC

## 2015-12-28 IMAGING — US US EXTREM UP*L* LTD
1 series · 11 of 11 positions shown · non-contrast
Comparison: None

CLINICAL DATA: Left upper arm mass

EXAM:
ULTRASOUND left UPPER EXTREMITY LIMITED
TECHNIQUE: Ultrasound examination of the upper extremity soft tissues was
performed in the area of clinical concern.

[Series 1: us extrem up*left* ltd · 0.15mm/px · 11 of 11 slices shown]
[im 1/11]
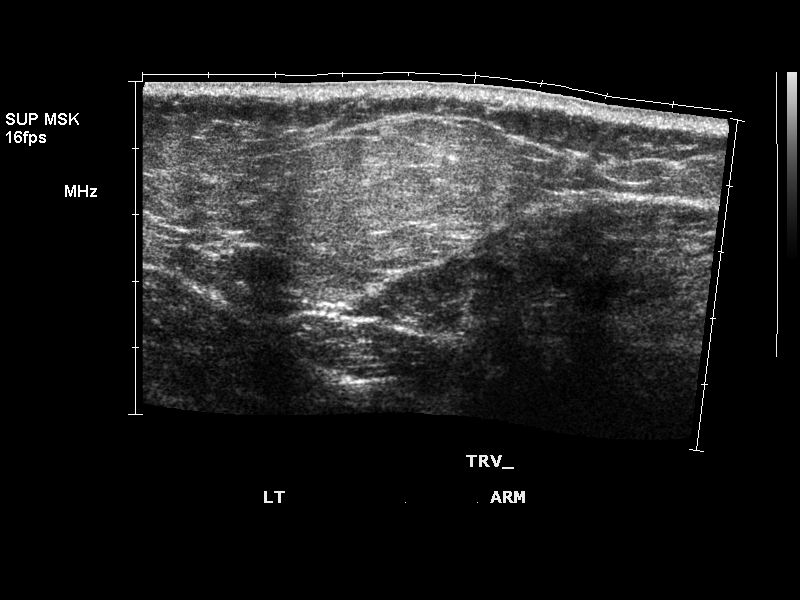
[im 2/11]
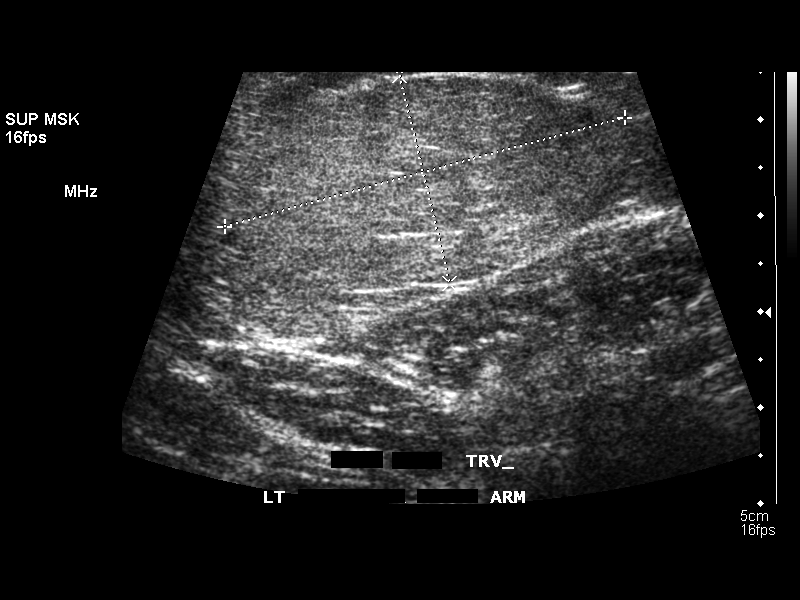
[im 3/11]
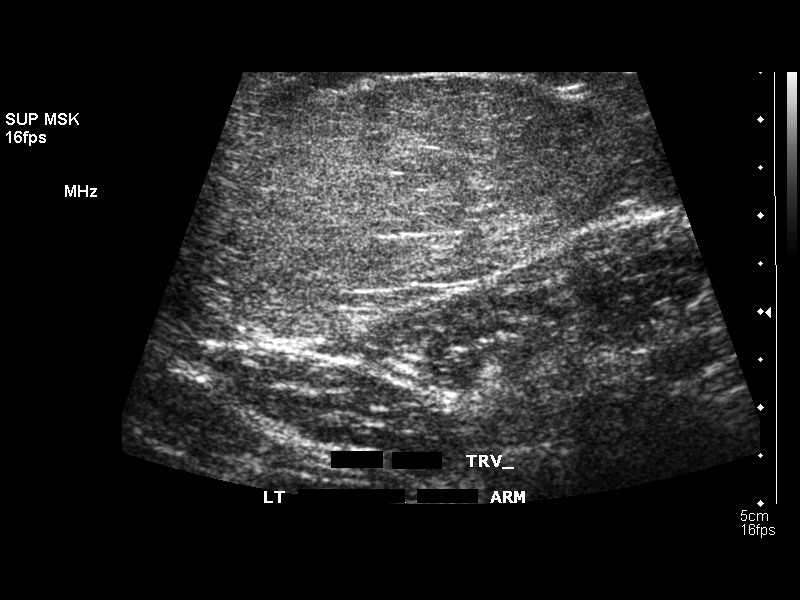
[im 4/11]
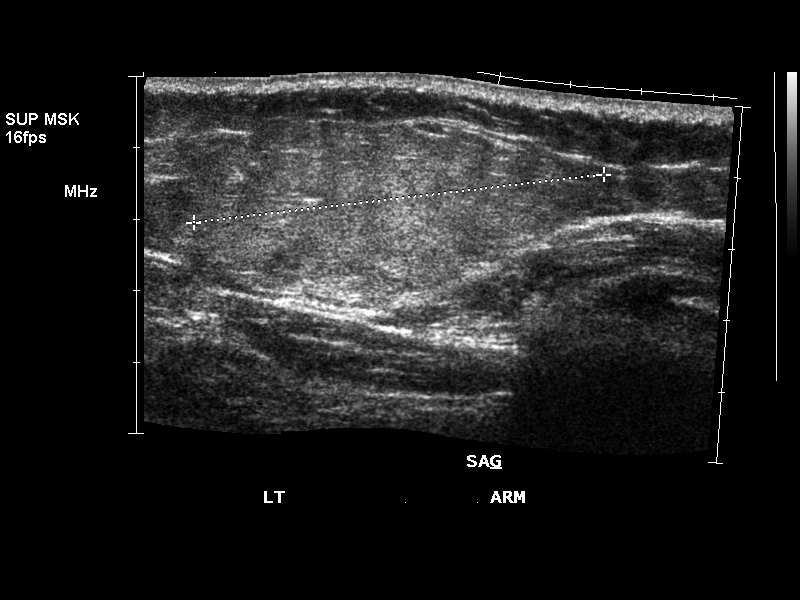
[im 5/11]
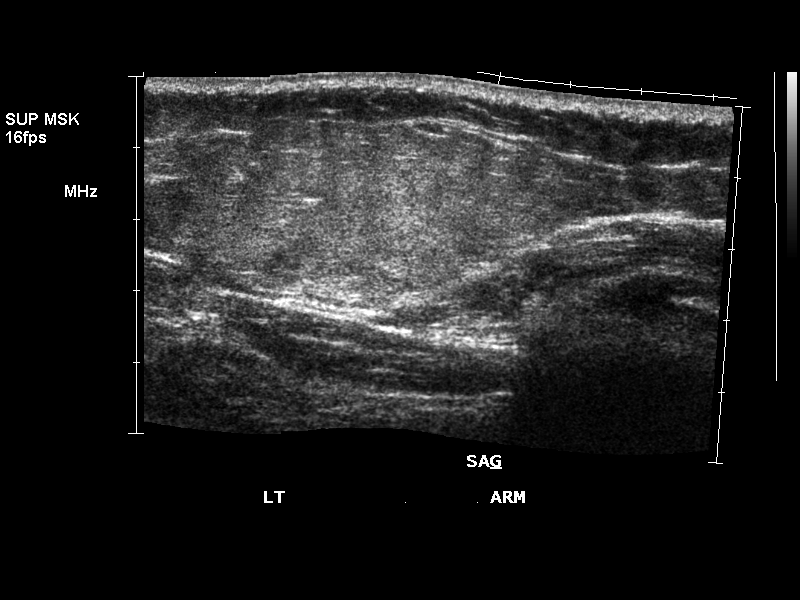
[im 6/11]
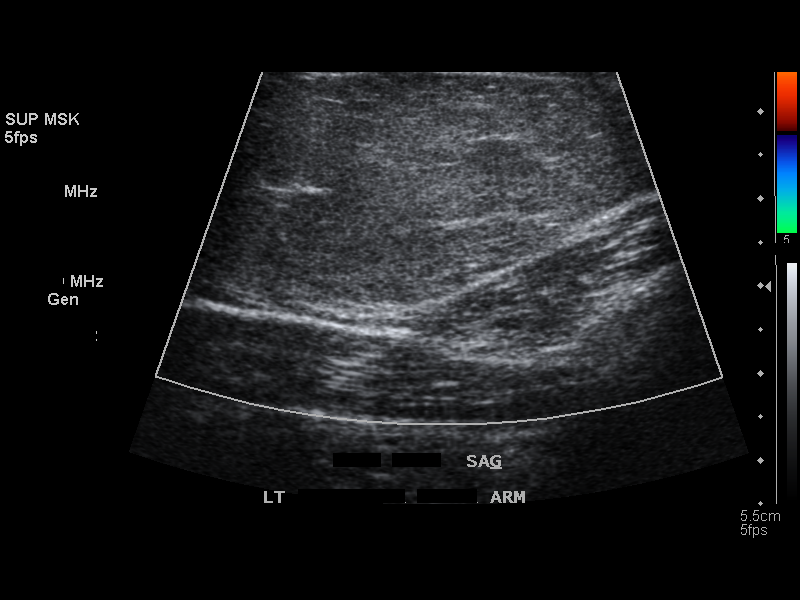
[im 7/11]
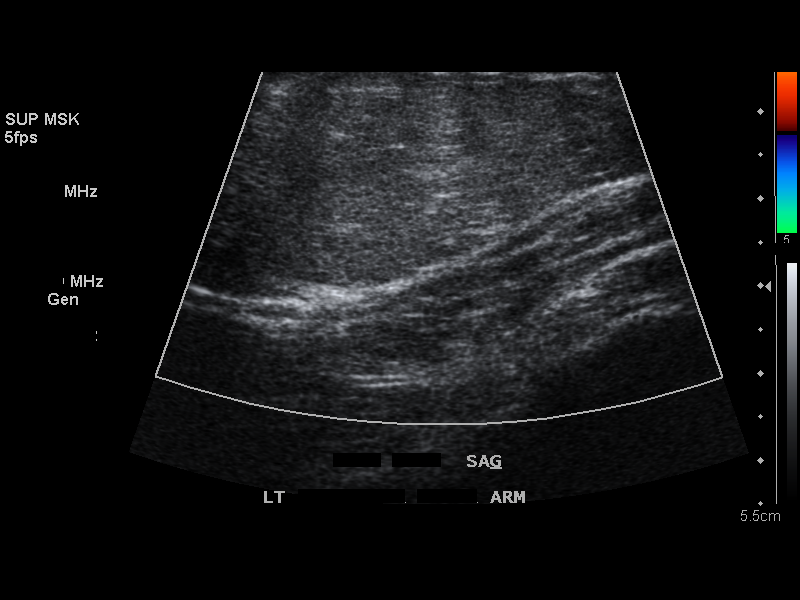
[im 8/11]
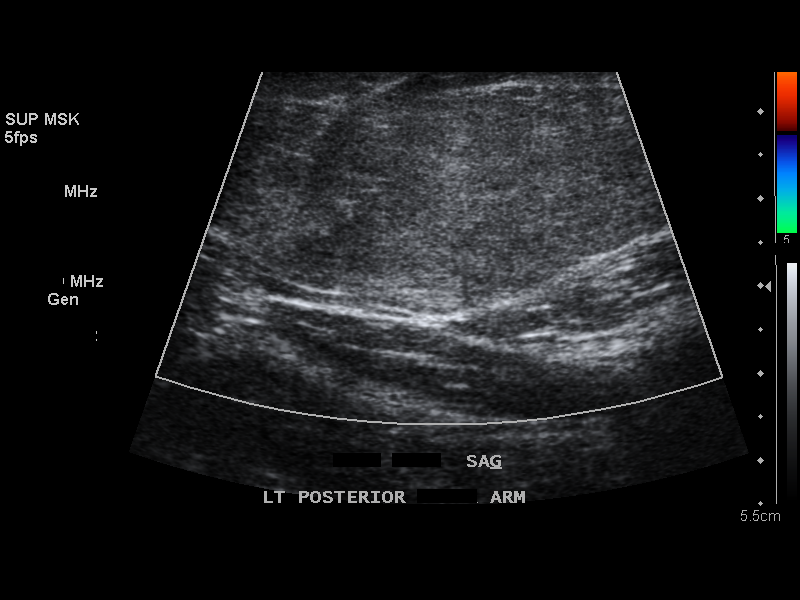
[im 9/11]
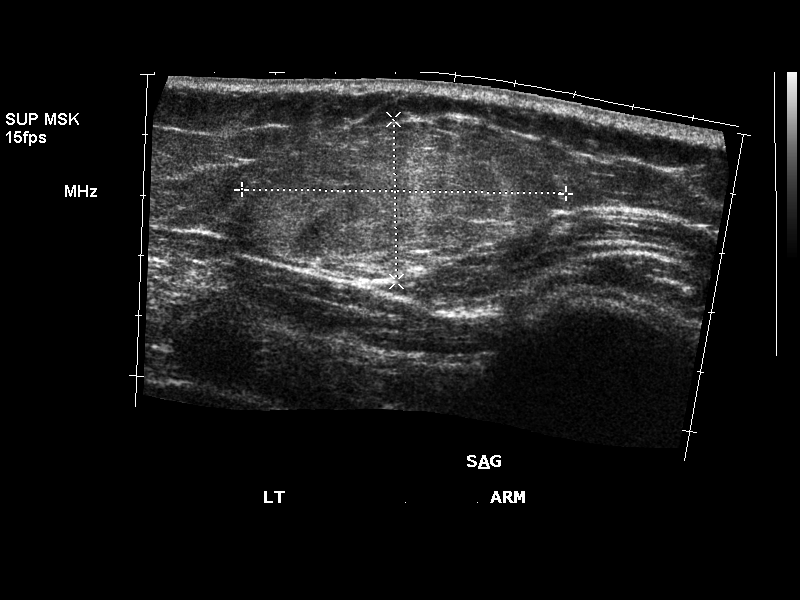
[im 10/11]
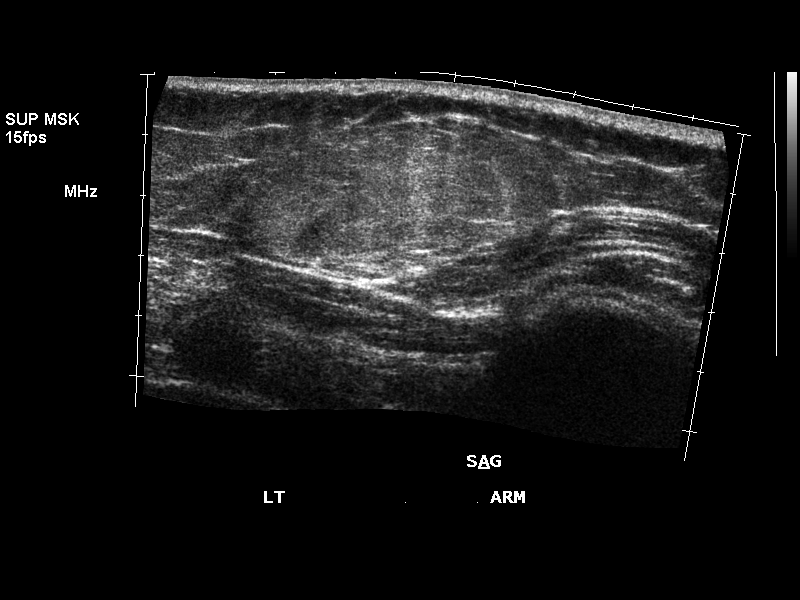
[im 11/11]
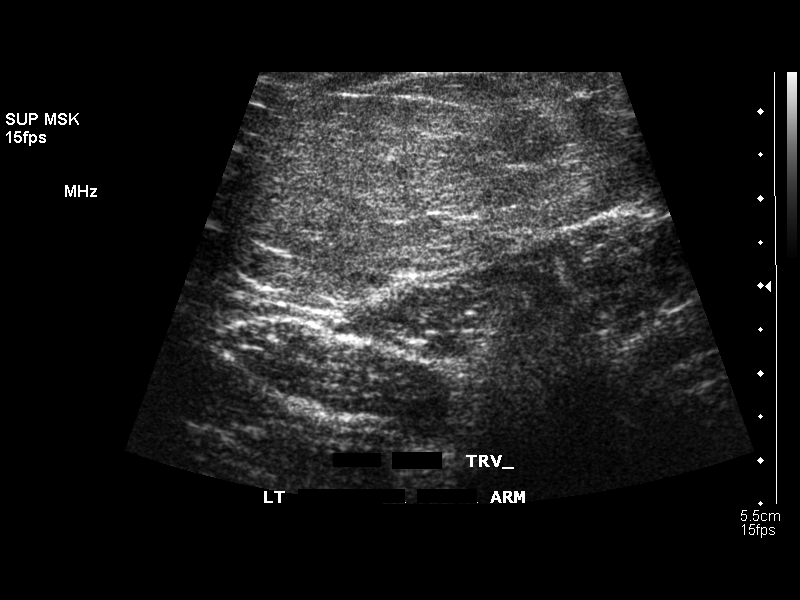

[11 of 11 positions shown; findings below may reference images not displayed]

FINDINGS: Ultrasound over the palpable mass in the left upper arm proximally
and laterally was performed. At that site there is an oval soft
tissue structure present of 4.3 x 2.2 x 5.8 cm. No blood flow was
seen. These is most consistent with a lipoma although it is tender
to the touch. Clinical followup is recommended. If concerned
clinically, then CT or MRI could be performed to assess further.
IMPRESSION: Oval soft tissue mass corresponds to the palpable abnormality, and
by ultrasound is most consistent with a lipoma. If further
assessment is warranted clinically then MRI or CT would be
recommended. MRI is more sensitive for assessment of soft tissue
structures.

## 2016-03-08 ENCOUNTER — Other Ambulatory Visit: Payer: Self-pay

## 2016-03-08 MED ORDER — NORELGESTROMIN-ETH ESTRADIOL 150-35 MCG/24HR TD PTWK: 1.0000 | MEDICATED_PATCH | TRANSDERMAL | Status: DC

## 2016-03-21 ENCOUNTER — Ambulatory Visit (INDEPENDENT_AMBULATORY_CARE_PROVIDER_SITE_OTHER): Payer: Medicare Other | Admitting: Gynecology

## 2016-03-21 ENCOUNTER — Encounter: Payer: Self-pay | Admitting: Gynecology

## 2016-03-21 VITALS — BP 136/80 | Wt 180.0 lb

## 2016-03-21 DIAGNOSIS — Z01419 Encounter for gynecological examination (general) (routine) without abnormal findings: Secondary | ICD-10-CM | POA: Diagnosis not present

## 2016-03-21 MED ORDER — NORELGESTROMIN-ETH ESTRADIOL 150-35 MCG/24HR TD PTWK: 1.0000 | MEDICATED_PATCH | TRANSDERMAL | 11 refills | Status: DC

## 2016-03-21 NOTE — Progress Notes (Signed)
Sydney Short 12-15-1970 [id number]   History:    45 y.o.  for annual gyn exam with no complaints today. Patient has had issues in the past with radiculopathy ranging from back to her leg with difficulty walking and has seen several orthopedic specialist and neurologist. She states that they have ruled out MS. the [facility] specialist has diagnosed her with CRPS (chronic regional pain syndrome) and has undergone spinal injections and most recently laser ablation.  Patient has been on Ortho Evra patch which had prescribed by my partner Dr. Cherylann Banas in the past who had been previously treating her for suspected early premature menopause with fluctuating FSH. I did bring up for discussion last year and this year of the concerns that I had because of her being 45 years of age with an estrogen with 35 mcg of ethinly estradiol and placing her at a higher risk for developing a DVT and a pulmonary embolism. She had been prescribed a 20 mcg oral contraceptive pill but decided that she would accept the risk and go on the Ortho Evra patch.Patient stated that when she was 44 years of age she had some form of dysplasia and was treated with cryotherapy. Her Pap smear since that time have been normal.   Past medical history,surgical history, family history and social history were all reviewed and documented in the EPIC chart.  Gynecologic History Patient's last menstrual period was 02/24/2016. Contraception: Ortho-Evra patches weekly Last Pap: 2015. Results were: normal Last mammogram: 2017. Results were: normal  Obstetric History OB History  Gravida Para Term Preterm AB Living  3 2 2   1 2   SAB TAB Ectopic Multiple Live Births  1            # Outcome Date GA Lbr Len/2nd Weight Sex Delivery Anes PTL Lv  3 Term           2 Term           1 SAB                ROS: A ROS was performed and pertinent positives and negatives are included in the history.  GENERAL: No fevers or chills. HEENT: No change in  vision, no earache, sore throat or sinus congestion. NECK: No pain or stiffness. CARDIOVASCULAR: No chest pain or pressure. No palpitations. PULMONARY: No shortness of breath, cough or wheeze. GASTROINTESTINAL: No abdominal pain, nausea, vomiting or diarrhea, melena or bright red blood per rectum. GENITOURINARY: No urinary frequency, urgency, hesitancy or dysuria. MUSCULOSKELETAL: No joint or muscle pain, no back pain, no recent trauma. DERMATOLOGIC: No rash, no itching, no lesions. ENDOCRINE: No polyuria, polydipsia, no heat or cold intolerance. No recent change in weight. HEMATOLOGICAL: No anemia or easy bruising or bleeding. NEUROLOGIC: No headache, seizures, numbness, tingling or weakness. PSYCHIATRIC: No depression, no loss of interest in normal activity or change in sleep pattern.     Exam: chaperone present  BP 136/80   Wt 180 lb (81.6 kg)   LMP 02/24/2016   BMI 30.90 kg/m   Body mass index is 30.9 kg/m.  General appearance : Well developed well nourished female. No acute distress HEENT: Eyes: no retinal hemorrhage or exudates,  Neck supple, trachea midline, no carotid bruits, no thyroidmegaly Lungs: Clear to auscultation, no rhonchi or wheezes, or rib retractions  Heart: Regular rate and rhythm, no murmurs or gallops Breast:Examined in sitting and supine position were symmetrical in appearance, no palpable masses or tenderness,  no skin retraction,  no nipple inversion, no nipple discharge, no skin discoloration, no axillary or supraclavicular lymphadenopathy Abdomen: no palpable masses or tenderness, no rebound or guarding Extremities: no edema or skin discoloration or tenderness  Pelvic:  Bartholin, Urethra, Skene Glands: Within normal limits             Vagina: No gross lesions or discharge  Cervix: No gross lesions or discharge  Uterus  anteverted, normal size, shape and consistency, non-tender and mobile  Adnexa  Without masses or tenderness  Anus and perineum  normal    Rectovaginal  normal sphincter tone without palpated masses or tenderness             Hemoccult not indicated     Assessment/Plan:  45 y.o. female for annual exam with chronic regional pain syndrome been evaluated and treated at [facility]. She is also currently undergoing occupational therapy as well. Her PCP has been doing her blood work.Prescription refill was given for the Ortho Evra patch and she fully accept the risk of potential DVT and pulmonary embolism. She was offered alternatives. We discussed importance of calcium vitamin D and regular exercise for osteoporosis prevention. Pap smear not indicated this year.    Terrance Mass MD, 11:52 AM 03/21/2016

## 2017-01-09 ENCOUNTER — Encounter: Payer: Self-pay | Admitting: Gynecology

## 2017-01-18 ENCOUNTER — Other Ambulatory Visit: Payer: Self-pay

## 2017-01-18 MED ORDER — NORELGESTROMIN-ETH ESTRADIOL 150-35 MCG/24HR TD PTWK: 1.0000 | MEDICATED_PATCH | TRANSDERMAL | 0 refills | Status: DC

## 2017-03-08 ENCOUNTER — Other Ambulatory Visit: Payer: Self-pay

## 2017-03-08 MED ORDER — NORELGESTROMIN-ETH ESTRADIOL 150-35 MCG/24HR TD PTWK: 1.0000 | MEDICATED_PATCH | TRANSDERMAL | 0 refills | Status: DC

## 2017-03-22 ENCOUNTER — Encounter: Payer: BC Managed Care – PPO | Admitting: Gynecology

## 2017-03-29 ENCOUNTER — Ambulatory Visit (INDEPENDENT_AMBULATORY_CARE_PROVIDER_SITE_OTHER): Payer: Medicare Other | Admitting: Gynecology

## 2017-03-29 ENCOUNTER — Encounter: Payer: Self-pay | Admitting: Gynecology

## 2017-03-29 VITALS — BP 132/80 | Ht 64.0 in | Wt 186.2 lb

## 2017-03-29 DIAGNOSIS — Z124 Encounter for screening for malignant neoplasm of cervix: Secondary | ICD-10-CM | POA: Diagnosis not present

## 2017-03-29 DIAGNOSIS — Z01419 Encounter for gynecological examination (general) (routine) without abnormal findings: Secondary | ICD-10-CM

## 2017-03-29 MED ORDER — NORELGESTROMIN-ETH ESTRADIOL 150-35 MCG/24HR TD PTWK: 1.0000 | MEDICATED_PATCH | TRANSDERMAL | 11 refills | Status: DC

## 2017-03-29 NOTE — Progress Notes (Signed)
Sydney Short 06-11-1971 [id number]   History:    46 y.o.  for annual gyn exam Patient has had issues in the past with radiculopathy ranging from back to her leg with difficulty walking and has seen several orthopedic specialist and neurologist. She states that they have ruled out MS. the [facility] specialist has diagnosed her with CRPS (chronic regional pain syndrome) and has undergone spinal injections and most recently laser ablation.  Patient has been on Ortho Evra patch which had prescribed by my partner Dr. Cherylann Banas in the past who had been previously treating her for suspected early premature menopause with fluctuating FSH. I did bring up for discussion the past 2 years and this year of the concerns that I had because of her being 46 years of age with an estrogen with 35 mcg of ethinly estradiol and placing her at a higher risk for developing a DVT and a pulmonary embolism. She had been prescribed a 20 mcg oral contraceptive pill but decided that she would accept the risk and go on the Ortho Evra patch.Patient stated that when she was 46 years of age she had some form of dysplasia and was treated with cryotherapy. Her Pap smear since that time have been normal.    Pwith no GYN complaints today.ast medical history,surgical history, family history and social history were all reviewed and documented in the EPIC chart.  Gynecologic History Patient's last menstrual period was 03/23/2017 (exact date). Contraception: Ortho-Evra patches weekly Last [id number]. Results were: normal Last [id number] in [place] we do not have the report. Results were: normal  Obstetric History OB History  Gravida Para Term Preterm AB Living  3 2 2   1 2   SAB TAB Ectopic Multiple Live Births  1            # Outcome Date GA Lbr Len/2nd Weight Sex Delivery Anes PTL Lv  3 Term           2 Term           1 SAB                ROS: A ROS was performed and pertinent positives and negatives are included in the history.  GENERAL: No fevers or chills. HEENT: No change in vision, no earache, sore throat or sinus congestion. NECK: No pain or stiffness. CARDIOVASCULAR: No chest pain or pressure. No palpitations. PULMONARY: No shortness of breath, cough or wheeze. GASTROINTESTINAL: No abdominal pain, nausea, vomiting or diarrhea, melena or bright red blood per rectum. GENITOURINARY: No urinary frequency, urgency, hesitancy or dysuria. MUSCULOSKELETAL: No joint or muscle pain, no back pain, no recent trauma. DERMATOLOGIC: No rash, no itching, no lesions. ENDOCRINE: No polyuria, polydipsia, no heat or cold intolerance. No recent change in weight. HEMATOLOGICAL: No anemia or easy bruising or bleeding. NEUROLOGIC: No headache, seizures, numbness, tingling or weakness. PSYCHIATRIC: No depression, no loss of interest in normal activity or change in sleep pattern.     Exam: chaperone present  BP 132/80   Ht 5\' 4"  (1.626 m)   Wt 186 lb 3.2 oz (84.5 kg)   LMP 03/23/2017 (Exact Date)   BMI 31.96 kg/m   Body mass index is 31.96 kg/m.  General appearance : Well developed well nourished female. No acute distress HEENT: Eyes: no retinal hemorrhage or exudates,  Neck supple, trachea midline, no carotid bruits, no thyroidmegaly Lungs: Clear to auscultation, no rhonchi or wheezes, or rib retractions  Heart: Regular rate and rhythm, no murmurs  or gallops Breast:Examined in sitting and supine position were symmetrical in appearance, no palpable masses or tenderness,  no skin retraction, no nipple inversion, no nipple discharge, no skin discoloration, no axillary or supraclavicular lymphadenopathy Abdomen: no palpable masses or tenderness, no rebound or guarding Extremities: no edema or skin discoloration or tenderness  Pelvic:  Bartholin, Urethra, Skene Glands: Within normal limits             Vagina: No gross lesions or discharge  Cervix: No gross lesions or discharge  UteAnteverted, normal size, shape and consistency,  non-tender and mobile  Adnexa  Without masses or tenderness  Anus and perineum  normal   Rectovaginal  normal sphincter tone without palpated masses or tenderness             HemNot indicated     Assessment/Plan:  46 y.o. female for annual exam with chronic regional pain syndrome been evaluated and treated at [facility]. She is also currently undergoing occupational therapy as well. Her PCP has been doing her blood work.Prescription refill was given for the Ortho Evra patch and she fully accept the risk of potential DVT and pulmonary embolism. She was offered alternatives. We discussed importance of calcium vitamin D and regular exercise for osteoporosis prevention. Pap smear done today.   Terrance Mass MD, 10:40 AM 03/29/2017

## 2017-03-29 NOTE — Addendum Note (Signed)
Addended by: Dorothyann Gibbs on: 03/29/2017 11:46 AM   Modules accepted: Orders

## 2017-04-02 LAB — PAP IG W/ RFLX HPV ASCU

## 2017-04-03 ENCOUNTER — Other Ambulatory Visit: Payer: Self-pay

## 2017-04-03 MED ORDER — NORELGESTROMIN-ETH ESTRADIOL 150-35 MCG/24HR TD PTWK: 1.0000 | MEDICATED_PATCH | TRANSDERMAL | 11 refills | Status: DC

## 2018-03-31 ENCOUNTER — Encounter: Payer: BC Managed Care – PPO | Admitting: Obstetrics & Gynecology

## 2018-04-07 ENCOUNTER — Other Ambulatory Visit: Payer: Self-pay

## 2018-04-07 MED ORDER — NORELGESTROMIN-ETH ESTRADIOL 150-35 MCG/24HR TD PTWK: 1.0000 | MEDICATED_PATCH | TRANSDERMAL | 0 refills | Status: DC

## 2018-04-14 ENCOUNTER — Encounter: Payer: BC Managed Care – PPO | Admitting: Obstetrics & Gynecology

## 2018-05-02 ENCOUNTER — Other Ambulatory Visit: Payer: Self-pay

## 2018-05-02 MED ORDER — NORELGESTROMIN-ETH ESTRADIOL 150-35 MCG/24HR TD PTWK: 1.0000 | MEDICATED_PATCH | TRANSDERMAL | 0 refills | Status: DC

## 2018-05-02 NOTE — Telephone Encounter (Signed)
CE Is scheduled with Dr. Marguerita Merles 07/18/18.

## 2018-06-11 ENCOUNTER — Telehealth: Payer: Self-pay

## 2018-06-11 NOTE — Telephone Encounter (Signed)
Patient last saw Dr. Moshe Salisbury for CE 03/29/2017.  He wrote "refill was given for the Ortho Evra patch and she fully accept the risk of potential DVT and pulmonary embolism."  She has appt with you for 09/03/18 for CE. She has rescheduled twice (menses and sickness).  She is needing refill on the patch.  [place] for refill until January?

## 2018-06-12 ENCOUNTER — Other Ambulatory Visit: Payer: Self-pay | Admitting: Obstetrics & Gynecology

## 2018-06-12 MED ORDER — NORELGESTROMIN-ETH ESTRADIOL 150-35 MCG/24HR TD PTWK: 1.0000 | MEDICATED_PATCH | TRANSDERMAL | 2 refills | Status: DC

## 2018-06-12 NOTE — Telephone Encounter (Signed)
Rx sent 

## 2018-06-12 NOTE — Telephone Encounter (Signed)
Yes agree with represcription of Ortho Evra patch until 09/03/2018.

## 2018-07-18 ENCOUNTER — Encounter: Payer: BC Managed Care – PPO | Admitting: Obstetrics & Gynecology

## 2018-09-03 ENCOUNTER — Ambulatory Visit (INDEPENDENT_AMBULATORY_CARE_PROVIDER_SITE_OTHER): Payer: Medicare Other | Admitting: Obstetrics & Gynecology

## 2018-09-03 ENCOUNTER — Encounter: Payer: Self-pay | Admitting: Obstetrics & Gynecology

## 2018-09-03 VITALS — BP 122/68 | Ht 64.0 in | Wt 163.0 lb

## 2018-09-03 DIAGNOSIS — Z3045 Encounter for surveillance of transdermal patch hormonal contraceptive device: Secondary | ICD-10-CM | POA: Diagnosis not present

## 2018-09-03 DIAGNOSIS — Z01419 Encounter for gynecological examination (general) (routine) without abnormal findings: Secondary | ICD-10-CM

## 2018-09-03 MED ORDER — NORELGESTROMIN-ETH ESTRADIOL 150-35 MCG/24HR TD PTWK: 1.0000 | MEDICATED_PATCH | TRANSDERMAL | 4 refills | Status: DC

## 2018-09-03 NOTE — Progress Notes (Signed)
Sydney Short 28-Jul-1971 [id number]   History:    48 y.o. [id number]  RP:  Established patient presenting for annual gyn exam   HPI: Well on the Xulane patch.  No metrorrhagia.  No pelvic pain.  No pain with intercourse.  Urine and bowel movements normal.  Breasts normal.  Health labs with family physician.  Past medical history,surgical history, family history and social history were all reviewed and documented in the EPIC chart.  Gynecologic History Patient's last menstrual period was 08/11/2018. Contraception: Ortho-Evra patches weekly Last Pap: 03/2017. Results were: Negative.  Transitional zone cells insufficient/absent Last mammogram: 2019. Results were: normal per patient Bone Density: Never Colonoscopy: 2005  Obstetric History OB History  Gravida Para Term Preterm AB Living  3 2 2   1 2   SAB TAB Ectopic Multiple Live Births  1            # Outcome Date GA Lbr Len/2nd Weight Sex Delivery Anes PTL Lv  3 Term           2 Term           1 SAB              ROS: A ROS was performed and pertinent positives and negatives are included in the history.  GENERAL: No fevers or chills. HEENT: No change in vision, no earache, sore throat or sinus congestion. NECK: No pain or stiffness. CARDIOVASCULAR: No chest pain or pressure. No palpitations. PULMONARY: No shortness of breath, cough or wheeze. GASTROINTESTINAL: No abdominal pain, nausea, vomiting or diarrhea, melena or bright red blood per rectum. GENITOURINARY: No urinary frequency, urgency, hesitancy or dysuria. MUSCULOSKELETAL: No joint or muscle pain, no back pain, no recent trauma. DERMATOLOGIC: No rash, no itching, no lesions. ENDOCRINE: No polyuria, polydipsia, no heat or cold intolerance. No recent change in weight. HEMATOLOGICAL: No anemia or easy bruising or bleeding. NEUROLOGIC: No headache, seizures, numbness, tingling or weakness. PSYCHIATRIC: No depression, no loss of interest in normal activity or change in sleep pattern.       Exam:   BP 122/68   Ht 5\' 4"  (1.626 m)   Wt 163 lb (73.9 kg)   LMP 08/11/2018 Comment: xulane  BMI 27.98 kg/m   Body mass index is 27.98 kg/m.  General appearance : Well developed well nourished female. No acute distress HEENT: Eyes: no retinal hemorrhage or exudates,  Neck supple, trachea midline, no carotid bruits, no thyroidmegaly Lungs: Clear to auscultation, no rhonchi or wheezes, or rib retractions  Heart: Regular rate and rhythm, no murmurs or gallops Breast:Examined in sitting and supine position were symmetrical in appearance, no palpable masses or tenderness,  no skin retraction, no nipple inversion, no nipple discharge, no skin discoloration, no axillary or supraclavicular lymphadenopathy Abdomen: no palpable masses or tenderness, no rebound or guarding Extremities: no edema or skin discoloration or tenderness  Pelvic: Vulva: Normal             Vagina: No gross lesions or discharge  Cervix: No gross lesions or discharge.  Pap reflex done  Uterus  AV, normal size, shape and consistency, non-tender and mobile  Adnexa  Without masses or tenderness  Anus: Normal   Assessment/Plan:  48 y.o. female for annual exam   1. Encounter for gynecological examination with Papanicolaou smear of cervix Normal gynecologic exam.  Pap reflex done.  Breast exam normal.  Will schedule annual screening mammogram.  Health labs with family physician. - Pap IG w/ reflex to  HPV when ASC-U  2. Encounter for surveillance of transdermal patch hormonal contraceptive device Well on Xulane patch.  No contraindication to continue.  Xulane patch represcribed.  Other orders - norelgestromin-ethinyl estradiol Marilu Favre) 150-35 MCG/24HR transdermal patch; Place 1 patch onto the skin every Monday.  Princess Bruins MD, 3:43 PM 09/03/2018

## 2018-09-08 ENCOUNTER — Encounter: Payer: Self-pay | Admitting: Obstetrics & Gynecology

## 2018-09-08 LAB — PAP IG W/ RFLX HPV ASCU

## 2018-09-08 NOTE — Patient Instructions (Signed)
1. Encounter for gynecological examination with Papanicolaou smear of cervix Normal gynecologic exam.  Pap reflex done.  Breast exam normal.  Will schedule annual screening mammogram.  Health labs with family physician. - Pap IG w/ reflex to HPV when ASC-U  2. Encounter for surveillance of transdermal patch hormonal contraceptive device Well on Xulane patch.  No contraindication to continue.  Xulane patch represcribed.  Other orders - norelgestromin-ethinyl estradiol Sydney Short) 150-35 MCG/24HR transdermal patch; Place 1 patch onto the skin every Monday.  Sydney Short, it was a pleasure meeting you today!  I will inform you of your results as soon as they are available.

## 2019-01-21 ENCOUNTER — Telehealth: Payer: Self-pay | Admitting: *Deleted

## 2019-01-21 MED ORDER — NORELGESTROMIN-ETH ESTRADIOL 150-35 MCG/24HR TD PTWK: 1.0000 | MEDICATED_PATCH | TRANSDERMAL | 4 refills | Status: DC

## 2019-01-21 NOTE — Telephone Encounter (Signed)
Patient called requesting refill on Xulane patch, Rx sent to pharmacy

## 2019-07-02 ENCOUNTER — Telehealth: Payer: Self-pay

## 2019-07-02 MED ORDER — XULANE 150-35 MCG/24HR TD PTWK: 1.0000 | MEDICATED_PATCH | TRANSDERMAL | 4 refills | Status: DC

## 2019-07-02 NOTE — Telephone Encounter (Signed)
Needs refill on bc patch until CE in January. Refills sent.

## 2019-09-08 ENCOUNTER — Ambulatory Visit: Payer: BC Managed Care – PPO

## 2019-09-08 ENCOUNTER — Encounter: Payer: BC Managed Care – PPO | Admitting: Obstetrics & Gynecology

## 2019-11-05 ENCOUNTER — Other Ambulatory Visit: Payer: Self-pay

## 2019-11-06 ENCOUNTER — Ambulatory Visit: Payer: BC Managed Care – PPO

## 2019-11-06 ENCOUNTER — Ambulatory Visit (INDEPENDENT_AMBULATORY_CARE_PROVIDER_SITE_OTHER): Payer: Medicare Other | Admitting: Obstetrics & Gynecology

## 2019-11-06 ENCOUNTER — Encounter: Payer: Self-pay | Admitting: Obstetrics & Gynecology

## 2019-11-06 VITALS — Ht 64.0 in | Wt 164.4 lb

## 2019-11-06 DIAGNOSIS — Z01419 Encounter for gynecological examination (general) (routine) without abnormal findings: Secondary | ICD-10-CM | POA: Diagnosis not present

## 2019-11-06 DIAGNOSIS — Z3045 Encounter for surveillance of transdermal patch hormonal contraceptive device: Secondary | ICD-10-CM | POA: Diagnosis not present

## 2019-11-06 MED ORDER — XULANE 150-35 MCG/24HR TD PTWK: 1.0000 | MEDICATED_PATCH | TRANSDERMAL | 4 refills | Status: DC

## 2019-11-06 NOTE — Progress Notes (Signed)
Sydney Short October 11, 1970 [id number]   History:    49 y.o. [id number]  RP:  Established patient presenting for annual gyn exam   HPI: Well on the Xulane patch.  No metrorrhagia.  No pelvic pain.  No pain with intercourse.  Urine and bowel movements normal.  Breasts normal.  Health labs with family physician.   Past medical history,surgical history, family history and social history were all reviewed and documented in the EPIC chart.  Gynecologic History Patient's last menstrual period was 10/31/2019.  Obstetric History OB History  Gravida Para Term Preterm AB Living  3 2 2   1 2   SAB TAB Ectopic Multiple Live Births  1            # Outcome Date GA Lbr Len/2nd Weight Sex Delivery Anes PTL Lv  3 Term           2 Term           1 SAB              ROS: A ROS was performed and pertinent positives and negatives are included in the history.  GENERAL: No fevers or chills. HEENT: No change in vision, no earache, sore throat or sinus congestion. NECK: No pain or stiffness. CARDIOVASCULAR: No chest pain or pressure. No palpitations. PULMONARY: No shortness of breath, cough or wheeze. GASTROINTESTINAL: No abdominal pain, nausea, vomiting or diarrhea, melena or bright red blood per rectum. GENITOURINARY: No urinary frequency, urgency, hesitancy or dysuria. MUSCULOSKELETAL: No joint or muscle pain, no back pain, no recent trauma. DERMATOLOGIC: No rash, no itching, no lesions. ENDOCRINE: No polyuria, polydipsia, no heat or cold intolerance. No recent change in weight. HEMATOLOGICAL: No anemia or easy bruising or bleeding. NEUROLOGIC: No headache, seizures, numbness, tingling or weakness. PSYCHIATRIC: No depression, no loss of interest in normal activity or change in sleep pattern.     Exam:   Ht 5\' 4"  (1.626 m)   Wt 164 lb 6.4 oz (74.6 kg)   LMP 10/31/2019 Comment: PATCH  BMI 28.22 kg/m   Body mass index is 28.22 kg/m.  General appearance : Well developed well nourished female. No  acute distress HEENT: Eyes: no retinal hemorrhage or exudates,  Neck supple, trachea midline, no carotid bruits, no thyroidmegaly Lungs: Clear to auscultation, no rhonchi or wheezes, or rib retractions  Heart: Regular rate and rhythm, no murmurs or gallops Breast:Examined in sitting and supine position were symmetrical in appearance, no palpable masses or tenderness,  no skin retraction, no nipple inversion, no nipple discharge, no skin discoloration, no axillary or supraclavicular lymphadenopathy Abdomen: no palpable masses or tenderness, no rebound or guarding Extremities: no edema or skin discoloration or tenderness  Pelvic: Vulva: Normal             Vagina: No gross lesions or discharge  Cervix: No gross lesions or discharge  Uterus  AV, normal size, shape and consistency, non-tender and mobile  Adnexa  Without masses or tenderness  Anus: Normal   Assessment/Plan:  49 y.o. female for annual exam   1. Encounter for gynecological examination with Papanicolaou smear of cervix Normal gynecologic exam.  Pap test in January 2020 was negative, no indication to repeat this year.  Breast exam normal.  Screening mammogram to schedule now.  Health labs with family physician.  Body mass index 28.22.  Recommend a slightly lower calorie/carb diet and regular aerobic activities with light weightlifting.  2. Encounter for surveillance of transdermal patch hormonal contraceptive device  Well on Xulane patch.  No contraindication to continue.  Prescription sent to pharmacy.  Other orders - norelgestromin-ethinyl estradiol Marilu Favre) 150-35 MCG/24HR transdermal patch; Place 1 patch onto the skin every Monday.  Princess Bruins MD, 12:00 PM 11/06/2019

## 2019-11-07 ENCOUNTER — Encounter: Payer: Self-pay | Admitting: Obstetrics & Gynecology

## 2019-11-07 NOTE — Patient Instructions (Signed)
1. Encounter for gynecological examination with Papanicolaou smear of cervix Normal gynecologic exam.  Pap test in January 2020 was negative, no indication to repeat this year.  Breast exam normal.  Screening mammogram to schedule now.  Health labs with family physician.  Body mass index 28.22.  Recommend a slightly lower calorie/carb diet and regular aerobic activities with light weightlifting.  2. Encounter for surveillance of transdermal patch hormonal contraceptive device Well on Xulane patch.  No contraindication to continue.  Prescription sent to pharmacy.  Other orders - norelgestromin-ethinyl estradiol Sydney Short) 150-35 MCG/24HR transdermal patch; Place 1 patch onto the skin every Monday.  [name], it was a pleasure seeing you today!

## 2020-11-08 ENCOUNTER — Ambulatory Visit: Payer: Medicare Other

## 2020-11-08 ENCOUNTER — Encounter: Payer: Self-pay | Admitting: Obstetrics & Gynecology

## 2020-11-08 ENCOUNTER — Encounter: Payer: BC Managed Care – PPO | Admitting: Obstetrics & Gynecology

## 2020-11-08 ENCOUNTER — Ambulatory Visit (INDEPENDENT_AMBULATORY_CARE_PROVIDER_SITE_OTHER): Payer: Medicare Other | Admitting: Obstetrics & Gynecology

## 2020-11-08 ENCOUNTER — Other Ambulatory Visit: Payer: Self-pay

## 2020-11-08 VITALS — BP 116/70 | Ht 64.0 in | Wt 165.0 lb

## 2020-11-08 DIAGNOSIS — Z3045 Encounter for surveillance of transdermal patch hormonal contraceptive device: Secondary | ICD-10-CM | POA: Diagnosis not present

## 2020-11-08 DIAGNOSIS — Z01419 Encounter for gynecological examination (general) (routine) without abnormal findings: Secondary | ICD-10-CM | POA: Diagnosis not present

## 2020-11-08 DIAGNOSIS — F449 Dissociative and conversion disorder, unspecified: Secondary | ICD-10-CM | POA: Diagnosis not present

## 2020-11-08 MED ORDER — XULANE 150-35 MCG/24HR TD PTWK: 1.0000 | MEDICATED_PATCH | TRANSDERMAL | 5 refills | Status: DC

## 2020-11-08 NOTE — Progress Notes (Signed)
Sydney Short [birth year] [id number]   History:    50 y.o. G3P2A1L2 Married.  2 daughters.  [id number] presenting for annual gyn exam   GYJ:EHUD on the Xulane patch.  Started having migraines on day #1 of menses. No metrorrhagia. Occasional LLQ pains. Neuro type of pain with IC.  Pelvic floor spasms, starting PT. Urine and bowel movements normal. Breasts normal.  Screening mammo annually Negative per patient, will obtain report. BMI 28.32.  Health labs with family physician.  Developed a conversion syndrome 8 yrs ago, followed by Garment/textile technologist.   Past medical history,surgical history, family history and social history were all reviewed and documented in the EPIC chart.  Gynecologic History Patient's last menstrual period was 10/29/2020.  Obstetric History OB History  Gravida Para Term Preterm AB Living  3 2 2   1 2   SAB IAB Ectopic Multiple Live Births  1            # Outcome Date GA Lbr Len/2nd Weight Sex Delivery Anes PTL Lv  3 Term           2 Term           1 SAB              ROS: A ROS was performed and pertinent positives and negatives are included in the history.  GENERAL: No fevers or chills. HEENT: No change in vision, no earache, sore throat or sinus congestion. NECK: No pain or stiffness. CARDIOVASCULAR: No chest pain or pressure. No palpitations. PULMONARY: No shortness of breath, cough or wheeze. GASTROINTESTINAL: No abdominal pain, nausea, vomiting or diarrhea, melena or bright red blood per rectum. GENITOURINARY: No urinary frequency, urgency, hesitancy or dysuria. MUSCULOSKELETAL: No joint or muscle pain, no back pain, no recent trauma. DERMATOLOGIC: No rash, no itching, no lesions. ENDOCRINE: No polyuria, polydipsia, no heat or cold intolerance. No recent change in weight. HEMATOLOGICAL: No anemia or easy bruising or bleeding. NEUROLOGIC: No headache, seizures, numbness, tingling or weakness. PSYCHIATRIC: No depression, no loss of interest in  normal activity or change in sleep pattern.     Exam:   BP 116/70   Ht 5\' 4"  (1.626 m)   Wt 165 lb (74.8 kg)   LMP 10/29/2020 Comment: PATCH  BMI 28.32 kg/m   Body mass index is 28.32 kg/m.  General appearance : Well developed well nourished female. No acute distress HEENT: Eyes: no retinal hemorrhage or exudates,  Neck supple, trachea midline, no carotid bruits, no thyroidmegaly Lungs: Clear to auscultation, no rhonchi or wheezes, or rib retractions  Heart: Regular rate and rhythm, no murmurs or gallops Breast:Examined in sitting and supine position were symmetrical in appearance, no palpable masses or tenderness,  no skin retraction, no nipple inversion, no nipple discharge, no skin discoloration, no axillary or supraclavicular lymphadenopathy Abdomen: no palpable masses or tenderness, no rebound or guarding Extremities: no edema or skin discoloration or tenderness  Pelvic: Vulva: Normal             Vagina: No gross lesions or discharge  Cervix: No gross lesions or discharge  Uterus  AV, normal size, shape and consistency, non-tender and mobile  Adnexa  Without masses or tenderness  Anus: Normal    Assessment/Plan:  50 y.o. female for annual exam   1. Well female exam with routine gynecological exam Normal gynecologic exam.  Pap negative in 2020.  No indication for a pap test this year.  Breast exam normal.  Screening mammo to schedule.  Health labs with Fam MD.  BMI 28.32.  2. Encounter for surveillance of transdermal patch hormonal contraceptive device Well on Xulane.  No CI.  Represcribed.  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  Other orders - SUMAtriptan (IMITREX) 25 MG tablet; Take 25 mg by mouth every 2 (two) hours as needed for migraine. May repeat in 2 hours if headache persists or recurs. - norelgestromin-ethinyl estradiol Marilu Favre) 150-35 MCG/24HR transdermal patch; Place 1 patch onto the skin once a week. Continuous use for migraines  Princess Bruins  MD, 10:48 AM 11/08/2020

## 2020-11-14 ENCOUNTER — Encounter: Payer: Self-pay | Admitting: Obstetrics & Gynecology

## 2021-01-12 ENCOUNTER — Telehealth: Payer: Self-pay

## 2021-01-12 NOTE — Telephone Encounter (Signed)
Patient said at 11/08/20 visit with you it was discussed that she would try using the Xulane patch weekly continuously  instead of every 3 weeks.    She states this is not working out well for her. She c/o bleeding and passing clots for the last 2 1/2 weeks. She said she would like to go back on the weekly for 3 weeks regimen and wanted your thoughts on it.

## 2021-01-24 NOTE — Telephone Encounter (Signed)
Left detailed message and let her know that Dr. Dellis Filbert was fine with this.  I asked her to call me back and confirm that is how she will take it and I will send new Rx with correct directions.

## 2021-11-10 ENCOUNTER — Ambulatory Visit: Payer: Medicare Other | Admitting: Obstetrics & Gynecology

## 2021-11-27 ENCOUNTER — Other Ambulatory Visit (HOSPITAL_COMMUNITY)
Admission: RE | Admit: 2021-11-27 | Discharge: 2021-11-27 | Disposition: A | Discharge status: Home / Self Care | Payer: Medicare Other | Source: Ambulatory Visit | Attending: Obstetrics & Gynecology | Admitting: Obstetrics & Gynecology

## 2021-11-27 ENCOUNTER — Encounter: Payer: Self-pay | Admitting: Obstetrics & Gynecology

## 2021-11-27 ENCOUNTER — Ambulatory Visit (INDEPENDENT_AMBULATORY_CARE_PROVIDER_SITE_OTHER): Payer: Medicare Other | Admitting: Obstetrics & Gynecology

## 2021-11-27 VITALS — BP 114/74 | HR 70 | Resp 16 | Ht 63.75 in | Wt 154.0 lb

## 2021-11-27 DIAGNOSIS — Z3045 Encounter for surveillance of transdermal patch hormonal contraceptive device: Secondary | ICD-10-CM

## 2021-11-27 DIAGNOSIS — Z01419 Encounter for gynecological examination (general) (routine) without abnormal findings: Secondary | ICD-10-CM

## 2021-11-27 DIAGNOSIS — Z9189 Other specified personal risk factors, not elsewhere classified: Secondary | ICD-10-CM | POA: Diagnosis not present

## 2021-11-27 DIAGNOSIS — F449 Dissociative and conversion disorder, unspecified: Secondary | ICD-10-CM

## 2021-11-27 DIAGNOSIS — E28319 Asymptomatic premature menopause: Secondary | ICD-10-CM | POA: Diagnosis not present

## 2021-11-27 DIAGNOSIS — N809 Endometriosis, unspecified: Secondary | ICD-10-CM

## 2021-11-27 NOTE — Progress Notes (Signed)
? ? ?Sydney Short [birth year] [id number] ? ? ?History:    51 y.o. . [id number] Married.  Husband vasectomized.  2 daughters doing very well. ?  ?RP:  Established patient presenting for annual gyn exam  ?  ?HPI: Well on the Xulane patch as HRT since early Menopause in her 82's.  Per patient, h/o Endometriosis.  No BTB.  Occasional LLQ pains.  Neuro type of pain with IC.  Pain better after a Pudendal block. Pelvic floor spasms PT treatment. Pap 08/2018 Neg. Urine and bowel movements normal.  Breasts normal.  Screening mammo Neg 10/2021.  BMI improved to 26.64. Health labs with family physician.  [place] 2021. Developed a conversion syndrome 9 yrs ago, followed by Neurologist. ? ?Past medical history,surgical history, family history and social history were all reviewed and documented in the EPIC chart. ? ?Gynecologic History ?Patient's last menstrual period was 11/04/2021 (exact date). ? ?Obstetric History ?OB History  ?Gravida Para Term Preterm AB Living  ?'3 2 2   1 2  '$ ?SAB IAB Ectopic Multiple Live Births  ?1          ?  ?# Outcome Date GA Lbr Len/2nd Weight Sex Delivery Anes PTL Lv  ?3 Term           ?2 Term           ?1 SAB           ? ? ? ?ROS: A ROS was performed and pertinent positives and negatives are included in the history. ? GENERAL: No fevers or chills. HEENT: No change in vision, no earache, sore throat or sinus congestion. NECK: No pain or stiffness. CARDIOVASCULAR: No chest pain or pressure. No palpitations. PULMONARY: No shortness of breath, cough or wheeze. GASTROINTESTINAL: No abdominal pain, nausea, vomiting or diarrhea, melena or bright red blood per rectum. GENITOURINARY: No urinary frequency, urgency, hesitancy or dysuria. MUSCULOSKELETAL: No joint or muscle pain, no back pain, no recent trauma. DERMATOLOGIC: No rash, no itching, no lesions. ENDOCRINE: No polyuria, polydipsia, no heat or cold intolerance. No recent change in weight. HEMATOLOGICAL: No anemia or easy bruising or bleeding. NEUROLOGIC: No  headache, seizures, numbness, tingling or weakness. PSYCHIATRIC: No depression, no loss of interest in normal activity or change in sleep pattern.  ?  ? ?Exam: ? ? ?BP 114/74   Pulse 70   Resp 16   Ht 5' 3.75" (1.619 m)   Wt 154 lb (69.9 kg)   LMP 11/04/2021 (Exact Date)   BMI 26.64 kg/m?  ? ?Body mass index is 26.64 kg/m?. ? ?General appearance : Well developed well nourished female. No acute distress ?HEENT: Eyes: no retinal hemorrhage or exudates,  Neck supple, trachea midline, no carotid bruits, no thyroidmegaly ?Lungs: Clear to auscultation, no rhonchi or wheezes, or rib retractions  ?Heart: Regular rate and rhythm, no murmurs or gallops ?Breast:Examined in sitting and supine position were symmetrical in appearance, no palpable masses or tenderness,  no skin retraction, no nipple inversion, no nipple discharge, no skin discoloration, no axillary or supraclavicular lymphadenopathy ?Abdomen: no palpable masses or tenderness, no rebound or guarding ?Extremities: no edema or skin discoloration or tenderness ? ?Pelvic: Vulva: Normal ?            Vagina: No gross lesions or discharge ? Cervix: No gross lesions or discharge.  Pap reflex done. ? Uterus  AV, normal size, shape and consistency, non-tender and mobile ? Adnexa  Without masses or tenderness ? Anus: Normal ? ? ?Assessment/Plan:  51  y.o. female for annual exam  ? ?1. Encounter for routine gynecological examination with Papanicolaou smear of cervix ?Well on the Xulane patch as HRT since early Menopause in her 69's.  Per patient, h/o Endometriosis.  No BTB.  Occasional LLQ pains.  Neuro type of pain with IC.  Pain better after a Pudendal block. Pelvic floor spasms PT treatment. Pap 08/2018 Neg. Urine and bowel movements normal.  Breasts normal.  Screening mammo Neg 10/2021.  BMI improved to 26.64. Health labs with family physician.  [place] 2021. Developed a conversion syndrome 9 yrs ago, followed by Neurologist. ?- Cytology - PAP( [place]) ? ?2. Early  menopause ?Decision to stop the Xulane patch and f/u to confirm her menopausal status with an [facility] level. ?- [place]; Future ? ?3. Encounter for surveillance of transdermal patch hormonal contraceptive device ?Decision to stop the Xulane patch give patient's early menopause in her 44's and no need for contraception given her husband's Vasectomy status. ?- [place]; Future ? ?4. Relies on partner vasectomy for contraception ? ?5. Endometriosis ?Probably regressed with Menopause.  Will now observe off Xulane patch. ? ?6. Conversion disorder ? ?Other orders ?- buprenorphine (BUTRANS) 15 MCG/HR; 1 patch once a week. ?- SUMAtriptan (IMITREX) 50 MG tablet; Take 50 mg by mouth once as needed.  ? ?Princess Bruins MD, 4:20 PM 11/27/2021 ? ?  ?

## 2021-11-29 LAB — CYTOLOGY - PAP: Diagnosis: NEGATIVE

## 2021-12-03 ENCOUNTER — Encounter: Payer: Self-pay | Admitting: Obstetrics & Gynecology

## 2021-12-04 ENCOUNTER — Other Ambulatory Visit: Payer: Medicare Other

## 2021-12-04 DIAGNOSIS — E28319 Asymptomatic premature menopause: Secondary | ICD-10-CM

## 2021-12-04 DIAGNOSIS — Z3045 Encounter for surveillance of transdermal patch hormonal contraceptive device: Secondary | ICD-10-CM

## 2021-12-05 LAB — FOLLICLE STIMULATING HORMONE: FSH: 31.4 m[IU]/mL

## 2021-12-06 ENCOUNTER — Other Ambulatory Visit: Payer: Self-pay

## 2021-12-06 ENCOUNTER — Telehealth: Payer: Self-pay

## 2021-12-06 MED ORDER — XULANE 150-35 MCG/24HR TD PTWK: 1.0000 | MEDICATED_PATCH | TRANSDERMAL | 3 refills | Status: DC

## 2021-12-06 NOTE — Telephone Encounter (Signed)
Spoke with patient and informed her. Rx sent. 

## 2021-12-06 NOTE — Telephone Encounter (Signed)
Pt calling in re: to her recent [facility] levels confirming the menopause while being off of xulane patch. She was concerned that you didn't mention whether or not it was something she could continue to take with her result note/recommendation. She reports that night sweats and hot flashes are bothersome and also states she hasnt noticed much difference in the pains shes been having--theyre still present but hasnt noticed if gotten worse or better since being off xulane. Pt wants to get back on patch as long as it is ok with you and if not, she would like to know your reasonings why. She stated she would be happy to schedule a visit with you to discuss if that is what you would prefer. Please advise.  ?

## 2021-12-06 NOTE — Telephone Encounter (Signed)
Princess Bruins, MD  Judy Pimple D, CMA 36 minutes ago (1:40 PM)  ? ?Yes, important to be back on the Xulane patch.  Contraception is recommended for 2 years into menopause.   ? ?

## 2022-02-22 ENCOUNTER — Telehealth: Payer: Self-pay | Admitting: *Deleted

## 2022-02-22 MED ORDER — XULANE 150-35 MCG/24HR TD PTWK: 1.0000 | MEDICATED_PATCH | TRANSDERMAL | 3 refills | Status: DC

## 2022-02-23 NOTE — Telephone Encounter (Signed)
Xulance patch switched to mail order pharmacy.

## 2022-12-04 ENCOUNTER — Ambulatory Visit: Payer: Medicare Other | Admitting: Obstetrics & Gynecology

## 2022-12-25 ENCOUNTER — Ambulatory Visit (INDEPENDENT_AMBULATORY_CARE_PROVIDER_SITE_OTHER): Payer: Medicare Other | Admitting: Obstetrics & Gynecology

## 2022-12-25 ENCOUNTER — Encounter: Payer: Self-pay | Admitting: Obstetrics & Gynecology

## 2022-12-25 VITALS — BP 114/70 | HR 75 | Ht 64.0 in | Wt 144.0 lb

## 2022-12-25 DIAGNOSIS — Z3045 Encounter for surveillance of transdermal patch hormonal contraceptive device: Secondary | ICD-10-CM

## 2022-12-25 DIAGNOSIS — F449 Dissociative and conversion disorder, unspecified: Secondary | ICD-10-CM

## 2022-12-25 DIAGNOSIS — Z01419 Encounter for gynecological examination (general) (routine) without abnormal findings: Secondary | ICD-10-CM | POA: Diagnosis not present

## 2022-12-25 DIAGNOSIS — E28319 Asymptomatic premature menopause: Secondary | ICD-10-CM

## 2022-12-25 MED ORDER — NORELGESTROMIN-ETH ESTRADIOL 150-35 MCG/24HR TD PTWK: 1.0000 | MEDICATED_PATCH | TRANSDERMAL | 3 refills | Status: DC

## 2022-12-25 NOTE — Progress Notes (Signed)
Sydney Short [birth year] [id number]   History:    52 y.o. G23P2A1L2 Married.  Husband vasectomized.  2 daughters doing very well.   RP:  Established patient presenting for annual gyn exam    HPI: Well on the Xulane patch as HRT since early Menopause in her 30's.  Per patient, h/o Endometriosis.  No BTB.  No pelvic pain. Neuro type of pain with IC. 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  Pelvic floor spasms PT treatment and Nerve ablations. Pap 11/2021 Neg. Repeat at 3 years.  Urine and bowel movements normal.  Breasts normal. Screening mammo Neg 10/2022.  BMI 24.72. Health labs with family physician.  Colono 2021.   Past medical history,surgical history, family history and social history were all reviewed and documented in the EPIC chart.  Gynecologic History Patient's last menstrual period was 12/01/2022 (exact date).  Obstetric History OB History  Gravida Para Term Preterm AB Living  3 2 2   1 2   SAB IAB Ectopic Multiple Live Births  1            # Outcome Date GA Lbr Len/2nd Weight Sex Delivery Anes PTL Lv  3 Term           2 Term           1 SAB              ROS: A ROS was performed and pertinent positives and negatives are included in the history. GENERAL: No fevers or chills. HEENT: No change in vision, no earache, sore throat or sinus congestion. NECK: No pain or stiffness. CARDIOVASCULAR: No chest pain or pressure. No palpitations. PULMONARY: No shortness of breath, cough or wheeze. GASTROINTESTINAL: No abdominal pain, nausea, vomiting or diarrhea, melena or bright red blood per rectum. GENITOURINARY: No urinary frequency, urgency, hesitancy or dysuria. MUSCULOSKELETAL: No joint or muscle pain, no back pain, no recent trauma. DERMATOLOGIC: No rash, no itching, no lesions. ENDOCRINE: No polyuria, polydipsia, no heat or cold intolerance. No recent change in weight. HEMATOLOGICAL: No anemia or easy bruising or bleeding. NEUROLOGIC: No headache, seizures,  numbness, tingling or weakness. PSYCHIATRIC: No depression, no loss of interest in normal activity or change in sleep pattern.     Exam:   BP 114/70   Pulse 75   Ht 5\' 4"  (1.626 m)   Wt 144 lb (65.3 kg)   LMP 12/01/2022 (Exact Date) Comment: not sexually active-zulane patch  SpO2 99%   BMI 24.72 kg/m   Body mass index is 24.72 kg/m.  General appearance : Well developed well nourished female. No acute distress HEENT: Eyes: no retinal hemorrhage or exudates,  Neck supple, trachea midline, no carotid bruits, no thyroidmegaly Lungs: Clear to auscultation, no rhonchi or wheezes, or rib retractions  Heart: Regular rate and rhythm, no murmurs or gallops Breast:Examined in sitting and supine position were symmetrical in appearance, no palpable masses or tenderness,  no skin retraction, no nipple inversion, no nipple discharge, no skin discoloration, no axillary or supraclavicular lymphadenopathy Abdomen: no palpable masses or tenderness, no rebound or guarding Extremities: no edema or skin discoloration or tenderness  Pelvic: Vulva: Normal             Vagina: No gross lesions or discharge  Cervix: No gross lesions or discharge  Uterus  AV, normal size, shape and consistency, non-tender and mobile  Adnexa  Without masses or tenderness  Anus: Normal   Assessment/Plan:  52 y.o. female for annual  exam   1. Well female exam with routine gynecological exam Well on the Xulane patch as HRT since early Menopause in her 30's.  Per patient, h/o Endometriosis.  No BTB.  No pelvic pain. Neuro type of pain with IC. 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  Pelvic floor spasms PT treatment and Nerve ablations. Pap 11/2021 Neg. Repeat at 3 years.  Urine and bowel movements normal.  Breasts normal. Screening mammo Neg 10/2022.  BMI 24.72. Health labs with family physician.  Colono 2021.   2. Early menopause Well on the Xulane patch as HRT since early Menopause in her 58's.   Per patient, h/o Endometriosis.  No BTB.  No pelvic pain.  No CI to continue on the Xulane patch at this time, but counseling done on making the transition to HRT in the near future.  No CV risk factor, no h/o stroke.  Prescription sent to pharmacy.  3. Encounter for surveillance of transdermal patch hormonal contraceptive device Xulane patch prescription sent to pharmacy.  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  Other orders - ondansetron (ZOFRAN) 4 MG tablet; Take 4 mg by mouth every 8 (eight) hours as needed for nausea or vomiting. - norelgestromin-ethinyl estradiol [employer]) 150-35 MCG/24HR transdermal patch; Place 1 patch onto the skin once a week. Continuous use for migraines   Genia Del MD, 12:15 PM

## 2023-01-10 ENCOUNTER — Telehealth: Payer: Self-pay

## 2023-01-10 NOTE — Telephone Encounter (Signed)
Karena Addison (pharmacist) from [facility] called to report that latest Rx was not written as "Brand Only." And so they dispensed to the pt as generic and is requesting another rx to be sent and noted as "brand only" per pt's preference.   I noticed that rx was sent to alternate pharmacy so will inquire with pt first before sending to provider for authorization.   LVMTCB

## 2023-01-11 ENCOUNTER — Other Ambulatory Visit: Payer: Self-pay

## 2023-01-11 MED ORDER — NORELGESTROMIN-ETH ESTRADIOL 150-35 MCG/24HR TD PTWK: 1.0000 | MEDICATED_PATCH | TRANSDERMAL | 3 refills | Status: AC

## 2023-01-11 NOTE — Telephone Encounter (Signed)
Refill request came in saying that patient wants to use mail order for Xulane patch Medication refill request: xulane patch Last AEX:  12-25-22 Next AEX: not scheduled Last MMG (if hormonal medication request): per patient it was done this year 2024 & she is having it faxed to [place] Refill authorized: please approve if appropriate

## 2023-01-28 NOTE — Telephone Encounter (Signed)
No returned call. Will close for now and wait for pt to call if new rx is needed.

## 2023-12-26 ENCOUNTER — Ambulatory Visit: Payer: Medicare Other | Admitting: Obstetrics and Gynecology

## 2024-02-12 ENCOUNTER — Ambulatory Visit: Admitting: Obstetrics and Gynecology
